# Patient Record
Sex: Male | Born: 2000 | Hispanic: Yes | Marital: Single | Smoking: Never smoker
Health system: Southern US, Community
[De-identification: ages and names within clinical notes are randomized; demographics above are authoritative.]

## PROBLEM LIST (undated history)

## (undated) DIAGNOSIS — E119 Type 2 diabetes mellitus without complications: Secondary | ICD-10-CM

## (undated) HISTORY — PX: TONSILLECTOMY: SUR1361

---
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
## 2019-07-16 ENCOUNTER — Encounter (HOSPITAL_COMMUNITY): Payer: Self-pay

## 2019-07-16 ENCOUNTER — Emergency Department (HOSPITAL_COMMUNITY)
Admission: EM | Admit: 2019-07-16 | Discharge: 2019-07-16 | Disposition: A | Discharge status: Home / Self Care | Payer: Medicaid Other | Attending: Emergency Medicine | Admitting: Emergency Medicine

## 2019-07-16 ENCOUNTER — Other Ambulatory Visit: Payer: Self-pay

## 2019-07-16 ENCOUNTER — Emergency Department (HOSPITAL_COMMUNITY): Payer: Medicaid Other

## 2019-07-16 DIAGNOSIS — Z79899 Other long term (current) drug therapy: Secondary | ICD-10-CM | POA: Insufficient documentation

## 2019-07-16 DIAGNOSIS — R070 Pain in throat: Secondary | ICD-10-CM | POA: Diagnosis present

## 2019-07-16 DIAGNOSIS — Z20822 Contact with and (suspected) exposure to covid-19: Secondary | ICD-10-CM | POA: Diagnosis not present

## 2019-07-16 DIAGNOSIS — J069 Acute upper respiratory infection, unspecified: Secondary | ICD-10-CM | POA: Diagnosis not present

## 2019-07-16 DIAGNOSIS — R0789 Other chest pain: Secondary | ICD-10-CM

## 2019-07-16 LAB — SARS CORONAVIRUS 2 BY RT PCR (HOSPITAL ORDER, PERFORMED IN ~~LOC~~ HOSPITAL LAB): SARS Coronavirus 2: NEGATIVE

## 2019-07-16 LAB — GROUP A STREP BY PCR: Group A Strep by PCR: NOT DETECTED

## 2019-07-16 NOTE — ED Notes (Signed)
Pt verbalizes understanding of DC instructions. Pt belongings returned and is ambulatory out of ED.  

## 2019-07-16 NOTE — Discharge Instructions (Addendum)
Likely viral illness.  Your strep test is negative, your chest x-ray is normal.  Your Covid test is pending, recommend complete quarantine of 10 days after your last contact with your roommate.  Follow-up with your doctor as needed.

## 2019-07-16 NOTE — ED Provider Notes (Signed)
[facility]-EMERGENCY DEPT Provider Note   CSN: [id number] Arrival date & time: 07/16/19  1820     History Chief Complaint  Patient presents with  . Sore Throat    Andrew Schwartz is a 19 y.o. male.  19 year old male with complaint of sore throat for the past 2 weeks, also reports nausea and intermittent headaches.  No known exposure to strep.  Patient states that today when he was eating he had discomfort in his chest, only occurs with eating and raising his arms.  Denies difficulty swallowing or feeling like his food is getting stuck. States exposed to roommate who has COVID.  Andrew Schwartz was evaluated in Emergency Department on 07/16/2019 for the symptoms described in the history of present illness. He was evaluated in the context of the global COVID-19 pandemic, which necessitated consideration that the patient might be at risk for infection with the SARS-CoV-2 virus that causes COVID-19. Institutional protocols and algorithms that pertain to the evaluation of patients at risk for COVID-19 are in a state of rapid change based on information released by regulatory bodies including the CDC and federal and state organizations. These policies and algorithms were followed during the patient's care in the ED.         History reviewed. No pertinent past medical history.  There are no problems to display for this patient.   Past Surgical History:  Procedure Laterality Date  . TONSILLECTOMY         Family History  Problem Relation Age of Onset  . Hypertension Mother   . Diabetes Mother     Social History   Tobacco Use  . Smoking status: Never Smoker  . Smokeless tobacco: Never Used  Substance Use Topics  . Alcohol use: No  . Drug use: No    Home Medications Prior to Admission medications   Medication Sig Start Date End Date Taking? Authorizing Provider  ibuprofen (ADVIL) 800 MG tablet Take 1 tablet (800 mg total) by mouth every 8 (eight) hours  as needed for mild pain. 08/23/18   Ward, Layla Maw, DO  Multiple Vitamin (MULTIVITAMIN WITH MINERALS) TABS tablet Take 1 tablet by mouth daily.    [provider]  dicyclomine (BENTYL) 20 MG tablet Take 1 tablet (20 mg total) by mouth 2 (two) times daily. Patient taking differently: Take 20 mg by mouth 2 (two) times daily as needed for spasms.  08/02/18 08/23/18  Garlon Hatchet, PA-C    Allergies    Patient has no known allergies.  Review of Systems   Review of Systems  Constitutional: Negative for chills and fever.  HENT: Positive for sore throat. Negative for congestion.   Respiratory: Negative for cough.   Gastrointestinal: Positive for nausea.  Musculoskeletal: Positive for myalgias.  Skin: Negative for rash and wound.  Neurological: Positive for headaches.  Hematological: Negative for adenopathy.  All other systems reviewed and are negative.   Physical Exam Updated Vital Signs BP (!) 148/91 (BP Location: Left Arm)   Pulse 70   Temp 98.2 F (36.8 C) (Oral)   Resp 16   Ht 5\' 6"  (1.676 m)   Wt (!) 140.6 kg   SpO2 100%   BMI 50.04 kg/m   Physical Exam Vitals and nursing note reviewed.  Constitutional:      General: He is not in acute distress.    Appearance: He is well-developed. He is not diaphoretic.  HENT:     Head: Normocephalic and atraumatic.  Right Ear: Tympanic membrane and ear canal normal.     Left Ear: Tympanic membrane and ear canal normal.     Mouth/Throat:     Mouth: Mucous membranes are moist.     Pharynx: Oropharynx is clear. Uvula midline. Posterior oropharyngeal erythema present. No pharyngeal swelling.     Tonsils: No tonsillar exudate or tonsillar abscesses. 1+ on the right. 1+ on the left.  Cardiovascular:     Rate and Rhythm: Normal rate and regular rhythm.     Heart sounds: Normal heart sounds.  Pulmonary:     Effort: Pulmonary effort is normal.     Breath sounds: Normal breath sounds.  Chest:     Chest wall: Tenderness present.   Skin:    General: Skin is warm and dry.  Neurological:     Mental Status: He is alert and oriented to person, place, and time.  Psychiatric:        Behavior: Behavior normal.     ED Results / Procedures / Treatments   Labs (all labs ordered are listed, but only abnormal results are displayed) Labs Reviewed  GROUP A STREP BY PCR  SARS CORONAVIRUS 2 BY RT PCR (HOSPITAL ORDER, [facility])    EKG None  Radiology DG Chest Port 1 View  Result Date: 07/16/2019 CLINICAL DATA:  Chest discomfort, shortness of breath, patient may has COVID-19 EXAM: PORTABLE CHEST 1 VIEW COMPARISON:  Radiograph 09/24/2012 FINDINGS: Hypoventilatory changes. No consolidation, features of edema, pneumothorax, or effusion. Pulmonary vascularity is normally distributed. The cardiomediastinal contours are unremarkable. No acute osseous or soft tissue abnormality. IMPRESSION: Low volumes, otherwise no acute cardiopulmonary disease. Electronically Signed   By: Lovena Le M.D.   On: 07/16/2019 20:56    Procedures Procedures (including critical care time)  Medications Ordered in ED Medications - No data to display  ED Course  I have reviewed the triage vital signs and the nursing notes.  Pertinent labs & imaging results that were available during my care of the patient were reviewed by me and considered in my medical decision making (see chart for details).  Clinical Course as of Jul 16 2222  Sun Jul 15, 2728  [id number] 19 year old male with complaint of sore throat for 2 weeks, pain worse with swallowing, also reports pain in his chest only with eating.  On exam has chest wall tenderness, throat is unremarkable, no tender cervical lymphadenopathy.  Rapid strep is negative, chest x-ray is unremarkable.  Patient states that his roommate had Covid and would like to be tested for Covid, was advised to quarantine pending his test results and follow-up with PCP.   [LM]    Clinical Course User  Index [LM] Andrew Schwartz   MDM Rules/Calculators/A&P                      Final Clinical Impression(s) / ED Diagnoses Final diagnoses:  Viral URI    Rx / DC Orders ED Discharge Orders    None       Tacy Learn, PA-C 07/16/19 Lyons, Lamy, DO 07/16/19 2229

## 2019-07-16 NOTE — ED Triage Notes (Signed)
Patient c/o sore throat x 2 weeks. Patient states hurts more with swallowing. Patient is able to drink and speak complete sentences.

## 2019-11-13 ENCOUNTER — Emergency Department (HOSPITAL_COMMUNITY)
Admission: EM | Admit: 2019-11-13 | Discharge: 2019-11-13 | Disposition: A | Discharge status: Home / Self Care | Payer: Medicaid Other | Attending: Emergency Medicine | Admitting: Emergency Medicine

## 2019-11-13 ENCOUNTER — Encounter (HOSPITAL_COMMUNITY): Payer: Self-pay | Admitting: Emergency Medicine

## 2019-11-13 DIAGNOSIS — Z20822 Contact with and (suspected) exposure to covid-19: Secondary | ICD-10-CM | POA: Diagnosis not present

## 2019-11-13 DIAGNOSIS — N481 Balanitis: Secondary | ICD-10-CM | POA: Diagnosis not present

## 2019-11-13 DIAGNOSIS — N4829 Other inflammatory disorders of penis: Secondary | ICD-10-CM | POA: Diagnosis present

## 2019-11-13 DIAGNOSIS — R739 Hyperglycemia, unspecified: Secondary | ICD-10-CM | POA: Diagnosis not present

## 2019-11-13 DIAGNOSIS — N476 Balanoposthitis: Secondary | ICD-10-CM

## 2019-11-13 LAB — CBC WITH DIFFERENTIAL/PLATELET
Abs Immature Granulocytes: 0.05 10*3/uL (ref 0.00–0.07)
Basophils Absolute: 0.1 10*3/uL (ref 0.0–0.1)
Basophils Relative: 1 %
Eosinophils Absolute: 0.2 10*3/uL (ref 0.0–0.5)
Eosinophils Relative: 2 %
HCT: 45.2 % (ref 39.0–52.0)
Hemoglobin: 15.3 g/dL (ref 13.0–17.0)
Immature Granulocytes: 1 %
Lymphocytes Relative: 28 %
Lymphs Abs: 2.7 10*3/uL (ref 0.7–4.0)
MCH: 29.3 pg (ref 26.0–34.0)
MCHC: 33.8 g/dL (ref 30.0–36.0)
MCV: 86.4 fL (ref 80.0–100.0)
Monocytes Absolute: 0.6 10*3/uL (ref 0.1–1.0)
Monocytes Relative: 6 %
Neutro Abs: 6.2 10*3/uL (ref 1.7–7.7)
Neutrophils Relative %: 62 %
Platelets: 247 10*3/uL (ref 150–400)
RBC: 5.23 MIL/uL (ref 4.22–5.81)
RDW: 12.4 % (ref 11.5–15.5)
WBC: 9.8 10*3/uL (ref 4.0–10.5)
nRBC: 0 % (ref 0.0–0.2)

## 2019-11-13 LAB — SARS CORONAVIRUS 2 BY RT PCR (HOSPITAL ORDER, PERFORMED IN ~~LOC~~ HOSPITAL LAB): SARS Coronavirus 2: NEGATIVE

## 2019-11-13 LAB — COMPREHENSIVE METABOLIC PANEL
ALT: 76 U/L — ABNORMAL HIGH (ref 0–44)
AST: 41 U/L (ref 15–41)
Albumin: 4.1 g/dL (ref 3.5–5.0)
Alkaline Phosphatase: 97 U/L (ref 38–126)
Anion gap: 11 (ref 5–15)
BUN: 8 mg/dL (ref 6–20)
CO2: 25 mmol/L (ref 22–32)
Calcium: 9.5 mg/dL (ref 8.9–10.3)
Chloride: 100 mmol/L (ref 98–111)
Creatinine, Ser: 0.75 mg/dL (ref 0.61–1.24)
GFR calc Af Amer: >60 mL/min (ref >60)
GFR calc non Af Amer: >60 mL/min (ref >60)
Glucose, Bld: 389 mg/dL — ABNORMAL HIGH (ref 70–99)
Potassium: 3.9 mmol/L (ref 3.5–5.1)
Sodium: 136 mmol/L (ref 135–145)
Total Bilirubin: 0.8 mg/dL (ref 0.3–1.2)
Total Protein: 6.9 g/dL (ref 6.5–8.1)

## 2019-11-13 LAB — CBG MONITORING, ED: Glucose-Capillary: 408 mg/dL — ABNORMAL HIGH (ref 70–99)

## 2019-11-13 MED ORDER — METFORMIN HCL 500 MG PO TABS
1000.0000 mg | ORAL_TABLET | Freq: Once | ORAL | Status: AC
  Administered 2019-11-13: 1000 mg via ORAL
  Filled 2019-11-13: qty 2

## 2019-11-13 MED ORDER — SODIUM CHLORIDE 0.9 % IV BOLUS
1000.0000 mL | Freq: Once | INTRAVENOUS | Status: AC
  Administered 2019-11-13: 1000 mL via INTRAVENOUS

## 2019-11-13 MED ORDER — METFORMIN HCL 1000 MG PO TABS: 1000.0000 mg | ORAL_TABLET | Freq: Two times a day (BID) | ORAL | 1 refills | Status: DC

## 2019-11-13 MED ORDER — CLOTRIMAZOLE 1 % EX CREA: TOPICAL_CREAM | CUTANEOUS | 0 refills | Status: AC

## 2019-11-13 NOTE — ED Provider Notes (Signed)
[facility] EMERGENCY DEPARTMENT Provider Note   CSN: [id number] Arrival date & time: 11/13/19  0919     History Chief Complaint  Patient presents with  . Groin Swelling    Andrew Schwartz is a 19 y.o. male.  HPI Patient is a 19 year old male with no pertinent past medical history presented today for penis tip swelling that is been ongoing for approximately 4 days.  Patient denies any penile discharge.  He states he has only been sexually active with one partner who has not had any symptoms of STDs.  He denies any discharge or testicular pain.  He states that he is normally able to retract his foreskin but has not been able to since this morning.  He states that the pain is achy and constant now, denies any difficulty urinating.  Denies any fevers, chills, frequent urination dysuria urgency or hematuria.    History reviewed. No pertinent past medical history.  There are no problems to display for this patient.   Past Surgical History:  Procedure Laterality Date  . TONSILLECTOMY         Family History  Problem Relation Age of Onset  . Hypertension Mother   . Diabetes Mother     Social History   Tobacco Use  . Smoking status: Never Smoker  . Smokeless tobacco: Never Used  Vaping Use  . Vaping Use: Never used  Substance Use Topics  . Alcohol use: No  . Drug use: No    Home Medications Prior to Admission medications   Medication Sig Start Date End Date Taking? Authorizing Provider  clotrimazole (LOTRIMIN) 1 % cream Apply to affected area 2 times daily 11/13/19   Solon Augusta S, PA  ibuprofen (ADVIL) 800 MG tablet Take 1 tablet (800 mg total) by mouth every 8 (eight) hours as needed for mild pain. 08/23/18   Ward, Layla Maw, DO  metFORMIN (GLUCOPHAGE) 1000 MG tablet Take 1 tablet (1,000 mg total) by mouth 2 (two) times daily with a meal. 11/13/19 01/12/20  Gailen Shelter, PA  Multiple Vitamin (MULTIVITAMIN WITH MINERALS) TABS tablet Take 1  tablet by mouth daily.    [provider]  dicyclomine (BENTYL) 20 MG tablet Take 1 tablet (20 mg total) by mouth 2 (two) times daily. Patient taking differently: Take 20 mg by mouth 2 (two) times daily as needed for spasms.  08/02/18 08/23/18  Garlon Hatchet, PA-C    Allergies    Patient has no known allergies.  Review of Systems   Review of Systems  Constitutional: Negative for chills and fever.  HENT: Negative for congestion.   Respiratory: Negative for shortness of breath.   Cardiovascular: Negative for chest pain.  Gastrointestinal: Negative for abdominal pain.  Genitourinary: Positive for penile swelling.  Musculoskeletal: Negative for neck pain.    Physical Exam Updated Vital Signs BP 136/74 (BP Location: Right Arm)   Pulse 77   Temp 98.8 F (37.1 C) (Oral)   Resp 18   Ht 5\' 6"  (1.676 m)   Wt (!) 140.6 kg   SpO2 100%   BMI 50.04 kg/m   Physical Exam Vitals and nursing note reviewed.  Constitutional:      General: He is not in acute distress.    Appearance: Normal appearance. He is obese. He is not ill-appearing.  HENT:     Head: Normocephalic and atraumatic.     Mouth/Throat:     Mouth: Mucous membranes are moist.  Eyes:     General:  No scleral icterus.       Right eye: No discharge.        Left eye: No discharge.     Conjunctiva/sclera: Conjunctivae normal.  Pulmonary:     Effort: Pulmonary effort is normal.     Breath sounds: No stridor.  Abdominal:     Tenderness: There is no abdominal tenderness.  Genitourinary:    Testes: Normal.     Comments: Phimosis with balanitis; no DC. Minimal swelling at tip of penis.  Skin:    General: Skin is warm and dry.     Capillary Refill: Capillary refill takes less than 2 seconds.  Neurological:     Mental Status: He is alert and oriented to person, place, and time. Mental status is at baseline.     ED Results / Procedures / Treatments   Labs (all labs ordered are listed, but only abnormal results are  displayed) Labs Reviewed  COMPREHENSIVE METABOLIC PANEL - Abnormal; Notable for the following components:      Result Value   Glucose, Bld 389 (*)    ALT 76 (*)    All other components within normal limits  CBG MONITORING, ED - Abnormal; Notable for the following components:   Glucose-Capillary 408 (*)    All other components within normal limits  SARS CORONAVIRUS 2 BY RT PCR (HOSPITAL ORDER, PERFORMED IN [facility])  CBC WITH DIFFERENTIAL/PLATELET    EKG None  Radiology No results found.  Procedures Procedures (including critical care time)  Medications Ordered in ED Medications  sodium chloride 0.9 % bolus 1,000 mL (0 mLs Intravenous Stopped 11/13/19 1712)  metFORMIN (GLUCOPHAGE) tablet 1,000 mg (1,000 mg Oral Given 11/13/19 1905)    ED Course  I have reviewed the triage vital signs and the nursing notes.  Pertinent labs & imaging results that were available during my care of the patient were reviewed by me and considered in my medical decision making (see chart for details).  Patient here with balanoposthitis   Clinical Course as of Nov 13 1952  Mon Nov 13, 2019  1425 BS 200 two months ago. Will check CBG today to assess for new onset diabetes/hyperglycemia   [WF]    Clinical Course User Index [WF] Gailen Shelter, PA   Patient CBG was 400.  Patient given fluids and basic labs obtained which were unremarkable.  CBC abnormalities no electrolyte abnormalities with an anion gap that was within normal limits at 11.  Consulted Dr. Crissie Reese for recommendations for outpatient treatment versus admission for new onset diabetes.  Appreciate his expert consultation.  Will discharge patient with Metformin.  We will also treat the balanitis with clotrimazole.  Patient given urology follow-up as well as PCP follow-up which was followed by a very long education conversation about diet exercise and blood sugar.  Also discussed the importance of medication adherence and  the long-term effects of diabetes if left untreated.  MDM Rules/Calculators/A&P                          Final Clinical Impression(s) / ED Diagnoses Final diagnoses:  Balanitis  Hyperglycemia  Balanoposthitis    Rx / DC Orders ED Discharge Orders         Ordered    clotrimazole (LOTRIMIN) 1 % cream        11/13/19 1832    metFORMIN (GLUCOPHAGE) 1000 MG tablet  2 times daily with meals  11/13/19 1832           Gailen Shelter, PA 11/14/19 1954    Tegeler, Canary Brim, MD 11/15/19 1728

## 2019-11-13 NOTE — ED Triage Notes (Signed)
Pt states for the last 4 days he has had pain in the tip of his penis with redness and swelling. Pt denies any d/c from his penis.

## 2019-11-13 NOTE — Discharge Instructions (Addendum)
You have elevated blood sugar consistent with diabetes.  It is incredibly important for you to follow-up closely with your primary care doctor in order to have your blood sugars rechecked.  I have started you on a medication called Metformin which can cause some diarrhea.  You will begin taking 1000 mg at night every night and follow-up with your primary care physician/NP/PA in the next week or 2.  Please drink plenty of water.  It is very easy to get severely dehydrated with your blood sugars as high.  Your elevated blood sugar is a reason why your penis is swollen today.  Please apply the clotrimazole every 12 hours for the next 5 days if your symptoms have not resolved by then you can continue to apply the clotrimazole.  I have provided you with a urologist to follow-up with.  Call tomorrow to make an appointment.

## 2019-11-19 ENCOUNTER — Other Ambulatory Visit: Payer: Self-pay

## 2019-11-19 ENCOUNTER — Emergency Department (HOSPITAL_COMMUNITY)
Admission: EM | Admit: 2019-11-19 | Discharge: 2019-11-19 | Disposition: A | Discharge status: Home / Self Care | Payer: Medicaid Other | Attending: Emergency Medicine | Admitting: Emergency Medicine

## 2019-11-19 ENCOUNTER — Emergency Department (HOSPITAL_COMMUNITY): Payer: Medicaid Other

## 2019-11-19 ENCOUNTER — Encounter (HOSPITAL_COMMUNITY): Payer: Self-pay | Admitting: Emergency Medicine

## 2019-11-19 DIAGNOSIS — Z5321 Procedure and treatment not carried out due to patient leaving prior to being seen by health care provider: Secondary | ICD-10-CM | POA: Diagnosis not present

## 2019-11-19 DIAGNOSIS — R112 Nausea with vomiting, unspecified: Secondary | ICD-10-CM | POA: Diagnosis not present

## 2019-11-19 DIAGNOSIS — Z20822 Contact with and (suspected) exposure to covid-19: Secondary | ICD-10-CM | POA: Insufficient documentation

## 2019-11-19 DIAGNOSIS — R0602 Shortness of breath: Secondary | ICD-10-CM | POA: Insufficient documentation

## 2019-11-19 DIAGNOSIS — R42 Dizziness and giddiness: Secondary | ICD-10-CM | POA: Insufficient documentation

## 2019-11-19 LAB — SARS CORONAVIRUS 2 BY RT PCR (HOSPITAL ORDER, PERFORMED IN ~~LOC~~ HOSPITAL LAB): SARS Coronavirus 2: NEGATIVE

## 2019-11-19 LAB — BASIC METABOLIC PANEL
Anion gap: 12 (ref 5–15)
BUN: 14 mg/dL (ref 6–20)
CO2: 22 mmol/L (ref 22–32)
Calcium: 9.2 mg/dL (ref 8.9–10.3)
Chloride: 105 mmol/L (ref 98–111)
Creatinine, Ser: 0.83 mg/dL (ref 0.61–1.24)
GFR calc Af Amer: >60 mL/min (ref >60)
GFR calc non Af Amer: >60 mL/min (ref >60)
Glucose, Bld: 212 mg/dL — ABNORMAL HIGH (ref 70–99)
Potassium: 3.7 mmol/L (ref 3.5–5.1)
Sodium: 139 mmol/L (ref 135–145)

## 2019-11-19 LAB — CBC
HCT: 43.2 % (ref 39.0–52.0)
Hemoglobin: 14.2 g/dL (ref 13.0–17.0)
MCH: 29.2 pg (ref 26.0–34.0)
MCHC: 32.9 g/dL (ref 30.0–36.0)
MCV: 88.7 fL (ref 80.0–100.0)
Platelets: 257 10*3/uL (ref 150–400)
RBC: 4.87 MIL/uL (ref 4.22–5.81)
RDW: 12.3 % (ref 11.5–15.5)
WBC: 9.3 10*3/uL (ref 4.0–10.5)
nRBC: 0 % (ref 0.0–0.2)

## 2019-11-19 MED ORDER — SODIUM CHLORIDE 0.9% FLUSH: 3.0000 mL | Freq: Once | INTRAVENOUS | Status: DC

## 2019-11-19 NOTE — ED Triage Notes (Signed)
Pt reports SOB and nausea x 1 hour.  States he feels like he is going to pass out.

## 2020-01-26 ENCOUNTER — Emergency Department (HOSPITAL_COMMUNITY)
Admission: EM | Admit: 2020-01-26 | Discharge: 2020-01-27 | Disposition: A | Discharge status: Home / Self Care | Payer: Medicaid Other | Attending: Emergency Medicine | Admitting: Emergency Medicine

## 2020-01-26 ENCOUNTER — Other Ambulatory Visit: Payer: Self-pay

## 2020-01-26 ENCOUNTER — Encounter (HOSPITAL_COMMUNITY): Payer: Self-pay

## 2020-01-26 DIAGNOSIS — R112 Nausea with vomiting, unspecified: Secondary | ICD-10-CM | POA: Diagnosis present

## 2020-01-26 DIAGNOSIS — Z20822 Contact with and (suspected) exposure to covid-19: Secondary | ICD-10-CM | POA: Insufficient documentation

## 2020-01-26 DIAGNOSIS — R197 Diarrhea, unspecified: Secondary | ICD-10-CM | POA: Diagnosis not present

## 2020-01-26 LAB — COMPREHENSIVE METABOLIC PANEL
ALT: 36 U/L (ref 0–44)
AST: 20 U/L (ref 15–41)
Albumin: 4 g/dL (ref 3.5–5.0)
Alkaline Phosphatase: 62 U/L (ref 38–126)
Anion gap: 11 (ref 5–15)
BUN: 13 mg/dL (ref 6–20)
CO2: 25 mmol/L (ref 22–32)
Calcium: 9.3 mg/dL (ref 8.9–10.3)
Chloride: 109 mmol/L (ref 98–111)
Creatinine, Ser: 0.77 mg/dL (ref 0.61–1.24)
GFR, Estimated: >60 mL/min (ref >60)
Glucose, Bld: 125 mg/dL — ABNORMAL HIGH (ref 70–99)
Potassium: 3.2 mmol/L — ABNORMAL LOW (ref 3.5–5.1)
Sodium: 145 mmol/L (ref 135–145)
Total Bilirubin: 0.5 mg/dL (ref 0.3–1.2)
Total Protein: 6.7 g/dL (ref 6.5–8.1)

## 2020-01-26 LAB — CBC
HCT: 39 % (ref 39.0–52.0)
Hemoglobin: 13.1 g/dL (ref 13.0–17.0)
MCH: 30.3 pg (ref 26.0–34.0)
MCHC: 33.6 g/dL (ref 30.0–36.0)
MCV: 90.3 fL (ref 80.0–100.0)
Platelets: 248 10*3/uL (ref 150–400)
RBC: 4.32 MIL/uL (ref 4.22–5.81)
RDW: 13.1 % (ref 11.5–15.5)
WBC: 12.9 10*3/uL — ABNORMAL HIGH (ref 4.0–10.5)
nRBC: 0 % (ref 0.0–0.2)

## 2020-01-26 LAB — RESP PANEL BY RT-PCR (FLU A&B, COVID) ARPGX2
Influenza A by PCR: NEGATIVE
Influenza B by PCR: NEGATIVE
SARS Coronavirus 2 by RT PCR: NEGATIVE

## 2020-01-26 LAB — CBG MONITORING, ED: Glucose-Capillary: 120 mg/dL — ABNORMAL HIGH (ref 70–99)

## 2020-01-26 LAB — LIPASE, BLOOD: Lipase: 23 U/L (ref 11–51)

## 2020-01-26 MED ORDER — ONDANSETRON HCL 4 MG/2ML IJ SOLN
4.0000 mg | Freq: Once | INTRAMUSCULAR | Status: AC | PRN
  Administered 2020-01-26: 4 mg via INTRAVENOUS
  Filled 2020-01-26: qty 2

## 2020-01-26 MED ORDER — LACTATED RINGERS IV BOLUS
1000.0000 mL | Freq: Once | INTRAVENOUS | Status: AC
  Administered 2020-01-26: 1000 mL via INTRAVENOUS

## 2020-01-26 MED ORDER — ONDANSETRON 4 MG PO TBDP: 4.0000 mg | ORAL_TABLET | Freq: Three times a day (TID) | ORAL | 0 refills | Status: AC | PRN

## 2020-01-26 MED ORDER — ONDANSETRON 4 MG PO TBDP: 4.0000 mg | ORAL_TABLET | Freq: Three times a day (TID) | ORAL | 0 refills | Status: DC | PRN

## 2020-01-26 NOTE — Discharge Instructions (Signed)
Make sure you hydrate well, half Gatorade half water is good electrolyte sugar and hydration replacement.  Zofran is given for nausea vomiting.  Follow-up with your providers in a few days if you are not feeling better.

## 2020-01-26 NOTE — ED Provider Notes (Signed)
[facility]-EMERGENCY DEPT Provider Note   CSN: [id number] Arrival date & time: 01/26/20  2117     History Chief Complaint  Patient presents with  . Covid Symptoms    Andrew Schwartz is a 19 y.o. male.   Emesis Severity:  Moderate Duration:  1 day Timing:  Constant Quality:  Stomach contents Progression:  Unchanged Chronicity:  New Relieved by:  Nothing Worsened by:  Nothing Ineffective treatments:  None tried Associated symptoms: diarrhea   Associated symptoms: no arthralgias, no chills, no cough, no fever and no headaches        History reviewed. No pertinent past medical history.  There are no problems to display for this patient.   Past Surgical History:  Procedure Laterality Date  . TONSILLECTOMY         Family History  Problem Relation Age of Onset  . Hypertension Mother   . Diabetes Mother     Social History   Tobacco Use  . Smoking status: Never Smoker  . Smokeless tobacco: Never Used  Vaping Use  . Vaping Use: Never used  Substance Use Topics  . Alcohol use: No  . Drug use: No    Home Medications Prior to Admission medications   Medication Sig Start Date End Date Taking? Authorizing Provider  clotrimazole (LOTRIMIN) 1 % cream Apply to affected area 2 times daily 11/13/19   Solon Augusta S, PA  ibuprofen (ADVIL) 800 MG tablet Take 1 tablet (800 mg total) by mouth every 8 (eight) hours as needed for mild pain. 08/23/18   Ward, Layla Maw, DO  metFORMIN (GLUCOPHAGE) 1000 MG tablet Take 1 tablet (1,000 mg total) by mouth 2 (two) times daily with a meal. 11/13/19 01/12/20  Gailen Shelter, PA  Multiple Vitamin (MULTIVITAMIN WITH MINERALS) TABS tablet Take 1 tablet by mouth daily.    [provider]  ondansetron (ZOFRAN ODT) 4 MG disintegrating tablet Take 1 tablet (4 mg total) by mouth every 8 (eight) hours as needed for up to 10 doses for nausea or vomiting. 01/26/20   Sabino Donovan, MD  dicyclomine (BENTYL) 20 MG  tablet Take 1 tablet (20 mg total) by mouth 2 (two) times daily. Patient taking differently: Take 20 mg by mouth 2 (two) times daily as needed for spasms.  08/02/18 08/23/18  Garlon Hatchet, PA-C    Allergies    Patient has no known allergies.  Review of Systems   Review of Systems  Constitutional: Negative for chills and fever.  HENT: Negative for congestion and rhinorrhea.   Respiratory: Negative for cough and shortness of breath.   Cardiovascular: Negative for chest pain and palpitations.  Gastrointestinal: Positive for diarrhea and vomiting. Negative for nausea.  Genitourinary: Negative for difficulty urinating and dysuria.  Musculoskeletal: Negative for arthralgias and back pain.  Skin: Negative for color change and rash.  Neurological: Positive for light-headedness. Negative for headaches.    Physical Exam Updated Vital Signs BP 129/69   Pulse 61   Temp 97.9 F (36.6 C) (Oral)   Resp 18   SpO2 98%   Physical Exam Vitals and nursing note reviewed.  Constitutional:      General: He is not in acute distress.    Appearance: Normal appearance.  HENT:     Head: Normocephalic and atraumatic.     Nose: No rhinorrhea.     Mouth/Throat:     Mouth: Mucous membranes are moist.  Eyes:     General:  Right eye: No discharge.        Left eye: No discharge.     Conjunctiva/sclera: Conjunctivae normal.  Cardiovascular:     Rate and Rhythm: Normal rate and regular rhythm.  Pulmonary:     Effort: Pulmonary effort is normal.     Breath sounds: No stridor.  Abdominal:     General: Abdomen is flat. There is no distension.     Palpations: Abdomen is soft.     Tenderness: There is no abdominal tenderness. There is no guarding or rebound.  Musculoskeletal:        General: No deformity or signs of injury.  Skin:    General: Skin is warm and dry.     Capillary Refill: Capillary refill takes less than 2 seconds.  Neurological:     General: No focal deficit present.     Mental  Status: He is alert. Mental status is at baseline.     Motor: No weakness.  Psychiatric:        Mood and Affect: Mood normal.        Behavior: Behavior normal.        Thought Content: Thought content normal.     ED Results / Procedures / Treatments   Labs (all labs ordered are listed, but only abnormal results are displayed) Labs Reviewed  COMPREHENSIVE METABOLIC PANEL - Abnormal; Notable for the following components:      Result Value   Potassium 3.2 (*)    Glucose, Bld 125 (*)    All other components within normal limits  CBC - Abnormal; Notable for the following components:   WBC 12.9 (*)    All other components within normal limits  CBG MONITORING, ED - Abnormal; Notable for the following components:   Glucose-Capillary 120 (*)    All other components within normal limits  RESP PANEL BY RT-PCR (FLU A&B, COVID) ARPGX2  LIPASE, BLOOD  URINALYSIS, ROUTINE W REFLEX MICROSCOPIC    EKG None  Radiology No results found.  Procedures Procedures (including critical care time)  Medications Ordered in ED Medications  ondansetron (ZOFRAN) injection 4 mg (has no administration in time range)  lactated ringers bolus 1,000 mL (1,000 mLs Intravenous New Bag/Given 01/26/20 2230)    ED Course  I have reviewed the triage vital signs and the nursing notes.  Pertinent labs & imaging results that were available during my care of the patient were reviewed by me and considered in my medical decision making (see chart for details).    MDM Rules/Calculators/A&P                          24 hours nausea vomiting diarrhea.  No fevers.  Sick contact, negative for Covid here.  CBC shows mild leukocytosis.  No signs of peritonitis on exam.  Symptomatic control for fluids and antiemetics given.  Hopefully this patient's laboratory studies will be unremarkable and he can be discharged home with outpatient care and prescription for Zofran.  IV fluids complete, Zofran given patient feeling  slightly better.  Still feeling a little lightheaded but normotensive.  Glucose mildly elevated, potassium slightly decreased.  Overall normal labs and no signs of endorgan dysfunction.  He is safe for discharge home.  We will discontinue urine studies as he has no urinary symptoms.  Outpatient provider follow-up recommended prescription for antiemetics given.  Final Clinical Impression(s) / ED Diagnoses Final diagnoses:  Nausea vomiting and diarrhea    Rx / DC Orders  ED Discharge Orders         Ordered    ondansetron (ZOFRAN ODT) 4 MG disintegrating tablet  Every 8 hours PRN,   Status:  Discontinued        01/26/20 2343    ondansetron (ZOFRAN ODT) 4 MG disintegrating tablet  Every 8 hours PRN        01/26/20 2344           Sabino Donovan, MD 01/26/20 2344

## 2020-01-26 NOTE — ED Triage Notes (Signed)
Pt reports blurred vision, cough, mild shob, diarrhea and  Headache. Pt denies fever or chills. Pt sts he is diabetic. cbg 120 in triage.

## 2020-05-28 ENCOUNTER — Encounter (HOSPITAL_COMMUNITY): Payer: Self-pay

## 2020-05-28 ENCOUNTER — Emergency Department (HOSPITAL_COMMUNITY): Payer: Medicaid Other

## 2020-05-28 ENCOUNTER — Emergency Department (HOSPITAL_COMMUNITY)
Admission: EM | Admit: 2020-05-28 | Discharge: 2020-05-28 | Disposition: A | Discharge status: Home / Self Care | Payer: Medicaid Other | Attending: Emergency Medicine | Admitting: Emergency Medicine

## 2020-05-28 ENCOUNTER — Other Ambulatory Visit: Payer: Self-pay

## 2020-05-28 DIAGNOSIS — N342 Other urethritis: Secondary | ICD-10-CM | POA: Insufficient documentation

## 2020-05-28 DIAGNOSIS — Z20822 Contact with and (suspected) exposure to covid-19: Secondary | ICD-10-CM | POA: Insufficient documentation

## 2020-05-28 DIAGNOSIS — J069 Acute upper respiratory infection, unspecified: Secondary | ICD-10-CM | POA: Insufficient documentation

## 2020-05-28 DIAGNOSIS — Z7984 Long term (current) use of oral hypoglycemic drugs: Secondary | ICD-10-CM | POA: Insufficient documentation

## 2020-05-28 DIAGNOSIS — R739 Hyperglycemia, unspecified: Secondary | ICD-10-CM

## 2020-05-28 DIAGNOSIS — R300 Dysuria: Secondary | ICD-10-CM | POA: Diagnosis present

## 2020-05-28 DIAGNOSIS — E1165 Type 2 diabetes mellitus with hyperglycemia: Secondary | ICD-10-CM | POA: Diagnosis not present

## 2020-05-28 LAB — URINALYSIS, ROUTINE W REFLEX MICROSCOPIC
Bilirubin Urine: NEGATIVE
Glucose, UA: NEGATIVE mg/dL
Hgb urine dipstick: NEGATIVE
Ketones, ur: NEGATIVE mg/dL
Leukocytes,Ua: NEGATIVE
Nitrite: NEGATIVE
Protein, ur: NEGATIVE mg/dL
Specific Gravity, Urine: 1.036 — ABNORMAL HIGH (ref 1.005–1.030)
pH: 5 (ref 5.0–8.0)

## 2020-05-28 LAB — BASIC METABOLIC PANEL
Anion gap: 9 (ref 5–15)
BUN: 11 mg/dL (ref 6–20)
CO2: 23 mmol/L (ref 22–32)
Calcium: 8.8 mg/dL — ABNORMAL LOW (ref 8.9–10.3)
Chloride: 104 mmol/L (ref 98–111)
Creatinine, Ser: 0.77 mg/dL (ref 0.61–1.24)
GFR, Estimated: >60 mL/min (ref >60)
Glucose, Bld: 251 mg/dL — ABNORMAL HIGH (ref 70–99)
Potassium: 3.5 mmol/L (ref 3.5–5.1)
Sodium: 136 mmol/L (ref 135–145)

## 2020-05-28 LAB — CBC WITH DIFFERENTIAL/PLATELET
Abs Immature Granulocytes: 0.04 10*3/uL (ref 0.00–0.07)
Basophils Absolute: 0.1 10*3/uL (ref 0.0–0.1)
Basophils Relative: 0 %
Eosinophils Absolute: 0.1 10*3/uL (ref 0.0–0.5)
Eosinophils Relative: 1 %
HCT: 38.7 % — ABNORMAL LOW (ref 39.0–52.0)
Hemoglobin: 13.4 g/dL (ref 13.0–17.0)
Immature Granulocytes: 0 %
Lymphocytes Relative: 15 %
Lymphs Abs: 1.7 10*3/uL (ref 0.7–4.0)
MCH: 30.5 pg (ref 26.0–34.0)
MCHC: 34.6 g/dL (ref 30.0–36.0)
MCV: 88 fL (ref 80.0–100.0)
Monocytes Absolute: 1 10*3/uL (ref 0.1–1.0)
Monocytes Relative: 9 %
Neutro Abs: 8.8 10*3/uL — ABNORMAL HIGH (ref 1.7–7.7)
Neutrophils Relative %: 75 %
Platelets: 216 10*3/uL (ref 150–400)
RBC: 4.4 MIL/uL (ref 4.22–5.81)
RDW: 12.7 % (ref 11.5–15.5)
WBC: 11.7 10*3/uL — ABNORMAL HIGH (ref 4.0–10.5)
nRBC: 0 % (ref 0.0–0.2)

## 2020-05-28 LAB — SARS CORONAVIRUS 2 (TAT 6-24 HRS): SARS Coronavirus 2: NEGATIVE

## 2020-05-28 LAB — CBG MONITORING, ED: Glucose-Capillary: 210 mg/dL — ABNORMAL HIGH (ref 70–99)

## 2020-05-28 LAB — HIV ANTIBODY (ROUTINE TESTING W REFLEX): HIV Screen 4th Generation wRfx: NONREACTIVE

## 2020-05-28 MED ORDER — DOXYCYCLINE HYCLATE 100 MG PO TABS
100.0000 mg | ORAL_TABLET | Freq: Once | ORAL | Status: AC
  Administered 2020-05-28: 100 mg via ORAL
  Filled 2020-05-28: qty 1

## 2020-05-28 MED ORDER — DOXYCYCLINE HYCLATE 100 MG PO CAPS: 100.0000 mg | ORAL_CAPSULE | Freq: Two times a day (BID) | ORAL | 0 refills | Status: AC

## 2020-05-28 MED ORDER — ACETAMINOPHEN 500 MG PO TABS
1000.0000 mg | ORAL_TABLET | Freq: Once | ORAL | Status: AC
  Administered 2020-05-28: 1000 mg via ORAL
  Filled 2020-05-28: qty 2

## 2020-05-28 MED ORDER — SODIUM CHLORIDE 0.9 % IV BOLUS
1000.0000 mL | Freq: Once | INTRAVENOUS | Status: AC
  Administered 2020-05-28: 1000 mL via INTRAVENOUS

## 2020-05-28 MED ORDER — STERILE WATER FOR INJECTION IJ SOLN
INTRAMUSCULAR | Status: AC
  Administered 2020-05-28: 10 mL
  Filled 2020-05-28: qty 10

## 2020-05-28 MED ORDER — METFORMIN HCL 1000 MG PO TABS: 1000.0000 mg | ORAL_TABLET | Freq: Two times a day (BID) | ORAL | 1 refills | Status: AC

## 2020-05-28 MED ORDER — CEFTRIAXONE SODIUM 500 MG IJ SOLR
500.0000 mg | Freq: Once | INTRAMUSCULAR | Status: AC
  Administered 2020-05-28: 500 mg via INTRAMUSCULAR
  Filled 2020-05-28: qty 500

## 2020-05-28 NOTE — ED Provider Notes (Signed)
[facility] EMERGENCY DEPARTMENT Provider Note   CSN: [id number] Arrival date & time: 05/28/20  1103     History No chief complaint on file.   Seydou Hearns is a 20 y.o. male.  HPI 20 year old male presents with multiple complaints.  The chief complaint is dysuria for the last 2 days.  He has also been having some left-sided testicle pain.  He is not urinating more frequently than typical.  He states sometimes when he drinks sodas he will get this similar type pain but now this is happening despite not drinking sodas.  It is also more frequent.  No vomiting or abdominal pain or back pain.  He had unprotected intercourse with his girlfriend a couple days ago and this is the first time they have done that and so he is a little concerned about STI.  The patient also endorses sore throat, cough and congestion since yesterday.  Somewhat short of breath and somewhat has chest pain when coughing.  Endorses on and off occipital headache.  Has been lightheaded since standing since yesterday.  No vomiting or diarrhea. Has not been checking his glucose. Is out of his diabetic meds so he has been using his mom's.  History reviewed. No pertinent past medical history.  There are no problems to display for this patient.   Past Surgical History:  Procedure Laterality Date  . TONSILLECTOMY         Family History  Problem Relation Age of Onset  . Hypertension Mother   . Diabetes Mother     Social History   Tobacco Use  . Smoking status: Never Smoker  . Smokeless tobacco: Never Used  Vaping Use  . Vaping Use: Never used  Substance Use Topics  . Alcohol use: No  . Drug use: No    Home Medications Prior to Admission medications   Medication Sig Start Date End Date Taking? Authorizing Provider  doxycycline (VIBRAMYCIN) 100 MG capsule Take 1 capsule (100 mg total) by mouth 2 (two) times daily. One po bid x 7 days 05/28/20  Yes Pricilla Loveless, MD  clotrimazole (LOTRIMIN)  1 % cream Apply to affected area 2 times daily 11/13/19   Gailen Shelter, PA  ibuprofen (ADVIL) 800 MG tablet Take 1 tablet (800 mg total) by mouth every 8 (eight) hours as needed for mild pain. 08/23/18   Ward, Layla Maw, DO  metFORMIN (GLUCOPHAGE) 1000 MG tablet Take 1 tablet (1,000 mg total) by mouth 2 (two) times daily with a meal. 05/28/20 07/27/20  Pricilla Loveless, MD  Multiple Vitamin (MULTIVITAMIN WITH MINERALS) TABS tablet Take 1 tablet by mouth daily.    [provider]  ondansetron (ZOFRAN ODT) 4 MG disintegrating tablet Take 1 tablet (4 mg total) by mouth every 8 (eight) hours as needed for up to 10 doses for nausea or vomiting. 01/26/20   Sabino Donovan, MD  dicyclomine (BENTYL) 20 MG tablet Take 1 tablet (20 mg total) by mouth 2 (two) times daily. Patient taking differently: Take 20 mg by mouth 2 (two) times daily as needed for spasms.  08/02/18 08/23/18  Garlon Hatchet, PA-C    Allergies    Patient has no known allergies.  Review of Systems   Review of Systems  Constitutional: Positive for fever (subjective).  HENT: Positive for congestion and sore throat.   Respiratory: Positive for cough.   Gastrointestinal: Negative for abdominal pain, diarrhea and vomiting.  Genitourinary: Positive for dysuria and testicular pain. Negative for penile discharge.  Musculoskeletal: Negative for neck pain.  Neurological: Positive for light-headedness and headaches. Negative for syncope.  All other systems reviewed and are negative.   Physical Exam Updated Vital Signs BP 121/66 (BP Location: Right Arm)   Pulse 79   Temp 98.7 F (37.1 C)   Resp 16   SpO2 100%   Physical Exam Vitals and nursing note reviewed.  Constitutional:      Appearance: He is well-developed. He is obese.  HENT:     Head: Normocephalic and atraumatic.     Right Ear: External ear normal.     Left Ear: External ear normal.     Nose: Nose normal.     Mouth/Throat:     Pharynx: No oropharyngeal exudate or  posterior oropharyngeal erythema.  Eyes:     General:        Right eye: No discharge.        Left eye: No discharge.     Extraocular Movements: Extraocular movements intact.     Pupils: Pupils are equal, round, and reactive to light.  Cardiovascular:     Rate and Rhythm: Normal rate and regular rhythm.     Heart sounds: Normal heart sounds.  Pulmonary:     Effort: Pulmonary effort is normal.     Breath sounds: Normal breath sounds.  Abdominal:     Palpations: Abdomen is soft.     Tenderness: There is no abdominal tenderness.  Genitourinary:    Penis: Uncircumcised. No erythema, tenderness, discharge, swelling or lesions.      Testes:        Right: Tenderness or swelling not present.        Left: Tenderness present. Swelling not present.  Musculoskeletal:     Cervical back: Normal range of motion and neck supple. No rigidity.  Skin:    General: Skin is warm and dry.  Neurological:     Mental Status: He is alert.     Comments: CN 3-12 grossly intact. 5/5 strength in all 4 extremities. Grossly normal sensation. Normal finger to nose.   Psychiatric:        Mood and Affect: Mood is not anxious.     ED Results / Procedures / Treatments   Labs (all labs ordered are listed, but only abnormal results are displayed) Labs Reviewed  URINALYSIS, ROUTINE W REFLEX MICROSCOPIC - Abnormal; Notable for the following components:      Result Value   Color, Urine AMBER (*)    Specific Gravity, Urine 1.036 (*)    All other components within normal limits  BASIC METABOLIC PANEL - Abnormal; Notable for the following components:   Glucose, Bld 251 (*)    Calcium 8.8 (*)    All other components within normal limits  CBC WITH DIFFERENTIAL/PLATELET - Abnormal; Notable for the following components:   WBC 11.7 (*)    HCT 38.7 (*)    Neutro Abs 8.8 (*)    All other components within normal limits  CBG MONITORING, ED - Abnormal; Notable for the following components:   Glucose-Capillary 210 (*)     All other components within normal limits  SARS CORONAVIRUS 2 (TAT 6-24 HRS)  HIV ANTIBODY (ROUTINE TESTING W REFLEX)  RPR  GC/CHLAMYDIA PROBE AMP ([place]) NOT AT [facility]    EKG EKG Interpretation  Date/Time:  Tuesday May 28 2020 12:15:14 EDT Ventricular Rate:  88 PR Interval:  167 QRS Duration: 103 QT Interval:  360 QTC Calculation: 436 R Axis:   -30 Text Interpretation: Sinus  rhythm Left axis deviation Low voltage, precordial leads no acute ST/T changes similar to Nov 2021 Confirmed by Pricilla Loveless [id number]) on 05/28/2020 12:54:07 PM   Radiology DG Chest 2 View  Result Date: 05/28/2020 CLINICAL DATA:  Cough and congestion EXAM: CHEST - 2 VIEW COMPARISON:  November 19, 2019 FINDINGS: Lungs are clear. Heart size and pulmonary vascularity are normal. No adenopathy. No bone lesions. IMPRESSION: Lungs clear.  Cardiac silhouette normal. Electronically Signed   By: Bretta Bang III M.D.   On: 05/28/2020 12:24   US SCROTUM W/DOPPLER  Result Date: 05/28/2020 CLINICAL DATA:  LEFT testicle pain for 2 days. EXAM: SCROTAL ULTRASOUND DOPPLER ULTRASOUND OF THE TESTICLES TECHNIQUE: Complete ultrasound examination of the testicles, epididymis, and other scrotal structures was performed. Color and spectral Doppler ultrasound were also utilized to evaluate blood flow to the testicles. COMPARISON:  None FINDINGS: Right testicle Measurements: 4.5 x 2.5 x 2.5 cm. Microlithiasis without visible lesion in the RIGHT testis. Left testicle Measurements: 4.2 x 2.2 x 3.0 cm. Scattered microlithiasis. No visible intra testicular lesion. Right epididymis:  Normal in size and appearance. Left epididymis:  Normal in size and appearance. Hydrocele:  None visualized. Varicocele:  None visualized. Pulsed Doppler interrogation of both testes demonstrates normal low resistance waveforms on arterial and venous assessment in the RIGHT testis. Normal venous waveforms in the LEFT testis. Arterial waveforms as  measured, 1 normal and the other mildly elevated resistive index, more likely related to technical factors. IMPRESSION: 1. Microlithiasis without visible intra testicular lesion. Current literature suggests that testicular microlithiasis is not a significant independent risk factor for development of testicular carcinoma, and that follow up imaging is not warranted in the absence of other risk factors. Monthly testicular self-examination and annual physical exams are considered appropriate surveillance. If patient has other risk factors for testicular carcinoma, then referral to Urology should be considered. (Reference: DeCastro, et al.: A 5-Year Follow up Study of Asymptomatic Men with Testicular Microlithiasis. J Urol 2008; 179:1420-1423.) 2. Normal waveforms both arterial and venous in the RIGHT testis with single measurement that shows mild elevation of resistive index in the LEFT testis, symptomatic side but with preserved venous flow. This is felt to be more related to technical factors. If there is continued concern for testicular torsion could consider short interval follow-up. Electronically Signed   By: Donzetta Kohut M.D.   On: 05/28/2020 13:17    Procedures Procedures   Medications Ordered in ED Medications  sodium chloride 0.9 % bolus 1,000 mL (0 mLs Intravenous Stopped 05/28/20 1353)  acetaminophen (TYLENOL) tablet 1,000 mg (1,000 mg Oral Given 05/28/20 1214)  cefTRIAXone (ROCEPHIN) injection 500 mg (500 mg Intramuscular Given 05/28/20 1410)  doxycycline (VIBRA-TABS) tablet 100 mg (100 mg Oral Given 05/28/20 1409)  sterile water (preservative free) injection (10 mLs  Given 05/28/20 1410)    ED Course  I have reviewed the triage vital signs and the nursing notes.  Pertinent labs & imaging results that were available during my care of the patient were reviewed by me and considered in my medical decision making (see chart for details).    MDM Rules/Calculators/A&P                           Patient's sore throat is likely a viral URI with his cough and subjective fever. Will test for covid as well. CXR clear. Unclear cause of dizziness but has benign labs besides some hyperglycemia and benign neuro exam. No thunderclap  headache. No indication for neuro imaging. Given IV fluids. As for his dysuria, will treat as potential STI. Testicular ultrasound without obvious masses, torsion or epididymitis.  Will discharge home to follow-up with urology if continued to have pain.  Leandrew Koyanagidgar Klimaszewski was evaluated in Emergency Department on 05/28/2020 for the symptoms described in the history of present illness. He was evaluated in the context of the global COVID-19 pandemic, which necessitated consideration that the patient might be at risk for infection with the SARS-CoV-2 virus that causes COVID-19. Institutional protocols and algorithms that pertain to the evaluation of patients at risk for COVID-19 are in a state of rapid change based on information released by regulatory bodies including the CDC and federal and state organizations. These policies and algorithms were followed during the patient's care in the ED.  Final Clinical Impression(s) / ED Diagnoses Final diagnoses:  Urethritis  Hyperglycemia  Viral upper respiratory tract infection    Rx / DC Orders ED Discharge Orders         Ordered    doxycycline (VIBRAMYCIN) 100 MG capsule  2 times daily        05/28/20 1359    metFORMIN (GLUCOPHAGE) 1000 MG tablet  2 times daily with meals        05/28/20 1359           Pricilla LovelessGoldston, Zelie Asbill, MD 05/28/20 1516

## 2020-05-28 NOTE — ED Triage Notes (Signed)
Patient complains of dysuria and also complains of runny nose and congestion. NAD

## 2020-05-29 LAB — GC/CHLAMYDIA PROBE AMP (~~LOC~~) NOT AT ARMC
Chlamydia: NEGATIVE
Comment: NEGATIVE
Comment: NORMAL
Neisseria Gonorrhea: NEGATIVE

## 2020-05-29 LAB — RPR: RPR Ser Ql: NONREACTIVE

## 2020-09-13 ENCOUNTER — Emergency Department (HOSPITAL_COMMUNITY): Payer: Medicaid Other

## 2020-09-13 ENCOUNTER — Emergency Department (HOSPITAL_COMMUNITY)
Admission: EM | Admit: 2020-09-13 | Discharge: 2020-09-13 | Disposition: A | Discharge status: Home / Self Care | Payer: Medicaid Other | Attending: Emergency Medicine | Admitting: Emergency Medicine

## 2020-09-13 ENCOUNTER — Other Ambulatory Visit: Payer: Self-pay

## 2020-09-13 ENCOUNTER — Encounter (HOSPITAL_COMMUNITY): Payer: Self-pay

## 2020-09-13 DIAGNOSIS — R000 Tachycardia, unspecified: Secondary | ICD-10-CM | POA: Diagnosis not present

## 2020-09-13 DIAGNOSIS — R079 Chest pain, unspecified: Secondary | ICD-10-CM | POA: Diagnosis present

## 2020-09-13 DIAGNOSIS — K219 Gastro-esophageal reflux disease without esophagitis: Secondary | ICD-10-CM | POA: Insufficient documentation

## 2020-09-13 DIAGNOSIS — Z7984 Long term (current) use of oral hypoglycemic drugs: Secondary | ICD-10-CM | POA: Diagnosis not present

## 2020-09-13 DIAGNOSIS — M549 Dorsalgia, unspecified: Secondary | ICD-10-CM | POA: Diagnosis not present

## 2020-09-13 DIAGNOSIS — R1110 Vomiting, unspecified: Secondary | ICD-10-CM | POA: Diagnosis not present

## 2020-09-13 DIAGNOSIS — E119 Type 2 diabetes mellitus without complications: Secondary | ICD-10-CM | POA: Insufficient documentation

## 2020-09-13 HISTORY — DX: Type 2 diabetes mellitus without complications: E11.9

## 2020-09-13 LAB — BASIC METABOLIC PANEL
Anion gap: 5 (ref 5–15)
BUN: 13 mg/dL (ref 6–20)
CO2: 28 mmol/L (ref 22–32)
Calcium: 8.8 mg/dL — ABNORMAL LOW (ref 8.9–10.3)
Chloride: 103 mmol/L (ref 98–111)
Creatinine, Ser: 0.82 mg/dL (ref 0.61–1.24)
GFR, Estimated: >60 mL/min (ref >60)
Glucose, Bld: 239 mg/dL — ABNORMAL HIGH (ref 70–99)
Potassium: 4.1 mmol/L (ref 3.5–5.1)
Sodium: 136 mmol/L (ref 135–145)

## 2020-09-13 LAB — CBC
HCT: 41.2 % (ref 39.0–52.0)
Hemoglobin: 13.6 g/dL (ref 13.0–17.0)
MCH: 29.5 pg (ref 26.0–34.0)
MCHC: 33 g/dL (ref 30.0–36.0)
MCV: 89.4 fL (ref 80.0–100.0)
Platelets: 194 10*3/uL (ref 150–400)
RBC: 4.61 MIL/uL (ref 4.22–5.81)
RDW: 13 % (ref 11.5–15.5)
WBC: 8 10*3/uL (ref 4.0–10.5)
nRBC: 0 % (ref 0.0–0.2)

## 2020-09-13 LAB — TROPONIN I (HIGH SENSITIVITY): Troponin I (High Sensitivity): <2 ng/L (ref <18)

## 2020-09-13 MED ORDER — LIDOCAINE VISCOUS HCL 2 % MT SOLN
15.0000 mL | Freq: Once | OROMUCOSAL | Status: AC
  Administered 2020-09-13: 15 mL via ORAL
  Filled 2020-09-13: qty 15

## 2020-09-13 MED ORDER — ALUM & MAG HYDROXIDE-SIMETH 200-200-20 MG/5ML PO SUSP
30.0000 mL | Freq: Once | ORAL | Status: AC
  Administered 2020-09-13: 30 mL via ORAL
  Filled 2020-09-13: qty 30

## 2020-09-13 MED ORDER — FAMOTIDINE 20 MG PO TABS: 20.0000 mg | ORAL_TABLET | Freq: Two times a day (BID) | ORAL | 0 refills | Status: AC

## 2020-09-13 MED ORDER — ONDANSETRON 4 MG PO TBDP
4.0000 mg | ORAL_TABLET | Freq: Once | ORAL | Status: AC
  Administered 2020-09-13: 4 mg via ORAL
  Filled 2020-09-13: qty 1

## 2020-09-13 NOTE — Discharge Instructions (Addendum)
Take the prescribed medication as directed.  Try to limit spicy/acidic foods.  I would avoid NSAIDs for now (motrin/aleve/ibuprofen), use tylenol instead.  Do not exceed 2000mg  daily. Follow-up with your primary care doctor. Return to the ED for new or worsening symptoms.

## 2020-09-13 NOTE — ED Provider Notes (Signed)
[facility]-EMERGENCY DEPT Provider Note   CSN: [id number] Arrival date & time: 09/13/20  0303     History Chief Complaint  Patient presents with   Chest Pain   Back Pain    Andrew Schwartz is a 20 y.o. male.  The history is provided by the patient and medical records.  Chest Pain Associated symptoms: nausea   Back Pain Associated symptoms: chest pain    20 year old male with history of diabetes, presenting to the ED with multiple complaints.  Chest pain.  Intermittent for the past 3 to 4 weeks.  States he feels like he has been dehydrated as he has been outside in the heat.  He has been drinking a lot of lemon lime Gatorade which seems to make his pain a lot worse.  This does seem to calm down if he eats something starchy.  Reports pain feels like a burning sensation in middle of his chest and up into the throat.  It does cause some intermittent nausea as well.  No vomiting.  Also reports taking Aleve which seems to be worsening his symptoms as well.  He denies any known cardiac history.  Denies smoking.  Denies history of acid reflux. Diabetes--admits he has been eating poorly and drinking a lot of soda recently.  States he knows he should not but it just "taste better".  He denies any excessive thirst or frequent urination.  He has been taking his diabetes medications as prescribed.  Past Medical History:  Diagnosis Date   Diabetes mellitus without complication (HCC)     There are no problems to display for this patient.   Past Surgical History:  Procedure Laterality Date   TONSILLECTOMY         Family History  Problem Relation Age of Onset   Hypertension Mother    Diabetes Mother    Hypertension Father    Diabetes Father     Social History   Tobacco Use   Smoking status: Never   Smokeless tobacco: Never  Vaping Use   Vaping Use: Some days   Substances: Nicotine  Substance Use Topics   Alcohol use: No   Drug use: No    Home  Medications Prior to Admission medications   Medication Sig Start Date End Date Taking? Authorizing Provider  clotrimazole (LOTRIMIN) 1 % cream Apply to affected area 2 times daily 11/13/19   Gailen Shelter, PA  doxycycline (VIBRAMYCIN) 100 MG capsule Take 1 capsule (100 mg total) by mouth 2 (two) times daily. One po bid x 7 days 05/28/20   Pricilla Loveless, MD  ibuprofen (ADVIL) 800 MG tablet Take 1 tablet (800 mg total) by mouth every 8 (eight) hours as needed for mild pain. 08/23/18   Ward, Layla Maw, DO  metFORMIN (GLUCOPHAGE) 1000 MG tablet Take 1 tablet (1,000 mg total) by mouth 2 (two) times daily with a meal. 05/28/20 07/27/20  Pricilla Loveless, MD  Multiple Vitamin (MULTIVITAMIN WITH MINERALS) TABS tablet Take 1 tablet by mouth daily.    [provider]  ondansetron (ZOFRAN ODT) 4 MG disintegrating tablet Take 1 tablet (4 mg total) by mouth every 8 (eight) hours as needed for up to 10 doses for nausea or vomiting. 01/26/20   Sabino Donovan, MD  dicyclomine (BENTYL) 20 MG tablet Take 1 tablet (20 mg total) by mouth 2 (two) times daily. Patient taking differently: Take 20 mg by mouth 2 (two) times daily as needed for spasms.  08/02/18 08/23/18  Sharilyn Sites  M, PA-C    Allergies    Patient has no known allergies.  Review of Systems   Review of Systems  Cardiovascular:  Positive for chest pain.  Gastrointestinal:  Positive for nausea.  All other systems reviewed and are negative.  Physical Exam Updated Vital Signs BP 135/80   Pulse 100   Temp 99 F (37.2 C) (Oral)   Resp 20   Ht 5\' 6"  (1.676 m)   Wt (!) 145.2 kg   SpO2 100%   BMI 51.65 kg/m   Physical Exam Vitals and nursing note reviewed.  Constitutional:      Appearance: He is well-developed.  HENT:     Head: Normocephalic and atraumatic.  Eyes:     Conjunctiva/sclera: Conjunctivae normal.     Pupils: Pupils are equal, round, and reactive to light.  Cardiovascular:     Rate and Rhythm: Normal rate and regular  rhythm.     Heart sounds: Normal heart sounds.     Comments: HR 98 during exam Pulmonary:     Effort: Pulmonary effort is normal.     Breath sounds: Normal breath sounds. No wheezing or rhonchi.     Comments: Lungs clear, no distress Chest:     Comments: Mid-sternal chest area tender to touch without deformity or signs of trauma Abdominal:     General: Bowel sounds are normal.     Palpations: Abdomen is soft.  Musculoskeletal:        General: Normal range of motion.     Cervical back: Normal range of motion.  Skin:    General: Skin is warm and dry.  Neurological:     Mental Status: He is alert and oriented to person, place, and time.    ED Results / Procedures / Treatments   Labs (all labs ordered are listed, but only abnormal results are displayed) Labs Reviewed  BASIC METABOLIC PANEL - Abnormal; Notable for the following components:      Result Value   Glucose, Bld 239 (*)    Calcium 8.8 (*)    All other components within normal limits  CBC  TROPONIN I (HIGH SENSITIVITY)    EKG EKG Interpretation  Date/Time:  Friday September 13 2020 03:15:06 EDT Ventricular Rate:  120 PR Interval:  156 QRS Duration: 88 QT Interval:  314 QTC Calculation: 443 R Axis:   -39 Text Interpretation: Sinus tachycardia Left axis deviation Cannot rule out Anterior infarct , age undetermined Abnormal ECG Rate faster Confirmed by 12-16-1993 [phone]) on 09/13/2020 3:20:20 AM  Radiology DG Chest 2 View  Result Date: 09/13/2020 CLINICAL DATA:  20 year old male with chest pain. EXAM: CHEST - 2 VIEW COMPARISON:  Chest radiograph dated 05/28/2020. FINDINGS: Shallow inspiration. No focal consolidation, pleural effusion, or pneumothorax. The cardiac silhouette is within limits. No acute osseous pathology. IMPRESSION: No active cardiopulmonary disease. Electronically Signed   By: 05/30/2020 M.D.   On: 09/13/2020 03:35    Procedures Procedures   Medications Ordered in ED Medications  alum &  mag hydroxide-simeth (MAALOX/MYLANTA) 200-200-20 MG/5ML suspension 30 mL (has no administration in time range)    And  lidocaine (XYLOCAINE) 2 % viscous mouth solution 15 mL (has no administration in time range)  ondansetron (ZOFRAN-ODT) disintegrating tablet 4 mg (has no administration in time range)    ED Course  I have reviewed the triage vital signs and the nursing notes.  Pertinent labs & imaging results that were available during my care of the patient were reviewed by me  and considered in my medical decision making (see chart for details).    MDM Rules/Calculators/A&P                         20 year old male presenting to the ED with 3 to 4 weeks of intermittent chest pain.  This is worse after drinking lemon lime Gatorade and taking Advil.  States it feels like a burning pain in the middle of his chest and up into the throat.  Symptoms do seem to get better if he eats ice cream or starches.  He is afebrile and nontoxic in appearance here.  He was initially tachycardic on arrival but this is resolved by time of my evaluation without any acute intervention.  Does have some tenderness of the chest wall without any acute signs of trauma.  His EKG is nonischemic.  Labs are reassuring.  Does have some mild hyperglycemia but no evidence of DKA.  Troponin is negative.  Symptoms are suspicious for acid reflux.  Will treat with Zofran and GI cocktail.  6:24 AM Patient rpeorts feeling better after GI cocktail.  HR remains normal in the 80's on repeat exam.  Low suspicion for ACS, PE, dissection, other acute cardiac event.  Suspect this may be related to acid reflux.  Will start on Pepcid and have him limit spicy/acidic foods along with NSAIDs.  Close follow-up with PCP.  Return here for any new/acute changes.  Final Clinical Impression(s) / ED Diagnoses Final diagnoses:  Chest pain, unspecified type  Gastroesophageal reflux disease without esophagitis    Rx / DC Orders ED Discharge Orders      None        Garlon Hatchet, PA-C 09/13/20 [id number]    Glynn Octave, MD 09/13/20 [phone]

## 2020-09-13 NOTE — ED Triage Notes (Addendum)
Patient presents with back pain and chest pain. He said he feels like he is not taking care of himself. He said he has taken advil for his chest pain but it is not working. He said it feels like it is hard to breathe and that someone is poking him with a needle. Pt states it gets worse when he eats, it feels like indigestion. This has been going on for 3 weeks. 3 days ago he lifted something heavy and felt a crack in his back.

## 2020-09-14 ENCOUNTER — Other Ambulatory Visit: Payer: Self-pay

## 2020-09-14 ENCOUNTER — Emergency Department (HOSPITAL_COMMUNITY): Payer: Medicaid Other

## 2020-09-14 ENCOUNTER — Emergency Department (HOSPITAL_COMMUNITY)
Admission: EM | Admit: 2020-09-14 | Discharge: 2020-09-15 | Disposition: A | Discharge status: Home / Self Care | Payer: Medicaid Other | Attending: Emergency Medicine | Admitting: Emergency Medicine

## 2020-09-14 DIAGNOSIS — R0981 Nasal congestion: Secondary | ICD-10-CM | POA: Diagnosis not present

## 2020-09-14 DIAGNOSIS — Z20822 Contact with and (suspected) exposure to covid-19: Secondary | ICD-10-CM | POA: Diagnosis not present

## 2020-09-14 DIAGNOSIS — R61 Generalized hyperhidrosis: Secondary | ICD-10-CM | POA: Diagnosis not present

## 2020-09-14 DIAGNOSIS — R531 Weakness: Secondary | ICD-10-CM | POA: Diagnosis not present

## 2020-09-14 DIAGNOSIS — R042 Hemoptysis: Secondary | ICD-10-CM | POA: Insufficient documentation

## 2020-09-14 DIAGNOSIS — Z28310 Unvaccinated for covid-19: Secondary | ICD-10-CM | POA: Insufficient documentation

## 2020-09-14 DIAGNOSIS — R5383 Other fatigue: Secondary | ICD-10-CM | POA: Diagnosis not present

## 2020-09-14 DIAGNOSIS — R059 Cough, unspecified: Secondary | ICD-10-CM | POA: Insufficient documentation

## 2020-09-14 DIAGNOSIS — Z7984 Long term (current) use of oral hypoglycemic drugs: Secondary | ICD-10-CM | POA: Diagnosis not present

## 2020-09-14 DIAGNOSIS — E119 Type 2 diabetes mellitus without complications: Secondary | ICD-10-CM | POA: Insufficient documentation

## 2020-09-14 LAB — CBC WITH DIFFERENTIAL/PLATELET
Abs Immature Granulocytes: 0.03 10*3/uL (ref 0.00–0.07)
Basophils Absolute: 0 10*3/uL (ref 0.0–0.1)
Basophils Relative: 0 %
Eosinophils Absolute: 0.2 10*3/uL (ref 0.0–0.5)
Eosinophils Relative: 3 %
HCT: 40 % (ref 39.0–52.0)
Hemoglobin: 13.4 g/dL (ref 13.0–17.0)
Immature Granulocytes: 0 %
Lymphocytes Relative: 22 %
Lymphs Abs: 1.7 10*3/uL (ref 0.7–4.0)
MCH: 29.4 pg (ref 26.0–34.0)
MCHC: 33.5 g/dL (ref 30.0–36.0)
MCV: 87.7 fL (ref 80.0–100.0)
Monocytes Absolute: 0.8 10*3/uL (ref 0.1–1.0)
Monocytes Relative: 10 %
Neutro Abs: 5 10*3/uL (ref 1.7–7.7)
Neutrophils Relative %: 65 %
Platelets: 172 10*3/uL (ref 150–400)
RBC: 4.56 MIL/uL (ref 4.22–5.81)
RDW: 12.9 % (ref 11.5–15.5)
WBC: 7.7 10*3/uL (ref 4.0–10.5)
nRBC: 0 % (ref 0.0–0.2)

## 2020-09-14 LAB — COMPREHENSIVE METABOLIC PANEL
ALT: 51 U/L — ABNORMAL HIGH (ref 0–44)
AST: 36 U/L (ref 15–41)
Albumin: 3.9 g/dL (ref 3.5–5.0)
Alkaline Phosphatase: 61 U/L (ref 38–126)
Anion gap: 9 (ref 5–15)
BUN: 13 mg/dL (ref 6–20)
CO2: 24 mmol/L (ref 22–32)
Calcium: 8.5 mg/dL — ABNORMAL LOW (ref 8.9–10.3)
Chloride: 102 mmol/L (ref 98–111)
Creatinine, Ser: 0.92 mg/dL (ref 0.61–1.24)
GFR, Estimated: >60 mL/min (ref >60)
Glucose, Bld: 211 mg/dL — ABNORMAL HIGH (ref 70–99)
Potassium: 3.6 mmol/L (ref 3.5–5.1)
Sodium: 135 mmol/L (ref 135–145)
Total Bilirubin: 0.9 mg/dL (ref 0.3–1.2)
Total Protein: 7.1 g/dL (ref 6.5–8.1)

## 2020-09-14 LAB — RESP PANEL BY RT-PCR (FLU A&B, COVID) ARPGX2
Influenza A by PCR: NEGATIVE
Influenza B by PCR: NEGATIVE
SARS Coronavirus 2 by RT PCR: NEGATIVE

## 2020-09-14 LAB — LIPASE, BLOOD: Lipase: 28 U/L (ref 11–51)

## 2020-09-14 NOTE — ED Provider Notes (Signed)
[facility]-EMERGENCY DEPT Provider Note   CSN: [id number] Arrival date & time: 09/14/20  2117     History Chief Complaint  Patient presents with   Fatigue    Advit Trethewey is a 20 y.o. male past medical history of diabetes who presents for evaluation of cough, fatigue, generalized weakness.  He states this is been ongoing for about 3 weeks and feels like the cough is getting worse.  He states cough is productive of phlegm.  He was seen in the ED 3 days ago for evaluation of symptoms.  His work up was reassuring and he was discharged home.  He states that at home he has continued to cough.  He states that today, he had some blood when he coughed up.  He states that this is happened about 2 or 3 times.  He states he is not having any trouble breathing but he does have some nasal congestion.  He states that he feels some burning in his chest with coughing.  He states sometimes the sides will hurt to when he coughs.  He has not any abdominal pain, nausea/vomiting.  He has not measured any fevers but states he will have chills, sweats.  He denies any drastic weight loss.  He does state that sometimes he gets sweaty at night.  He used to smoke but quit about 2 to 3 years ago.  He has not been vaccinated for COVID.  No COVID exposure that he knows of.   The history is provided by the patient.      Past Medical History:  Diagnosis Date   Diabetes mellitus without complication (HCC)     There are no problems to display for this patient.   Past Surgical History:  Procedure Laterality Date   TONSILLECTOMY         Family History  Problem Relation Age of Onset   Hypertension Mother    Diabetes Mother    Hypertension Father    Diabetes Father     Social History   Tobacco Use   Smoking status: Never   Smokeless tobacco: Never  Vaping Use   Vaping Use: Some days   Substances: Nicotine  Substance Use Topics   Alcohol use: No   Drug use: No    Home  Medications Prior to Admission medications   Medication Sig Start Date End Date Taking? Authorizing Provider  acetaminophen (TYLENOL) 500 MG tablet Take 1,000 mg by mouth every 6 (six) hours as needed for moderate pain.   Yes [provider]  famotidine (PEPCID) 20 MG tablet Take 1 tablet (20 mg total) by mouth 2 (two) times daily. 09/13/20  Yes Garlon Hatchet, PA-C  ibuprofen (ADVIL) 200 MG tablet Take 400 mg by mouth every 6 (six) hours as needed for headache.   Yes [provider]  metFORMIN (GLUCOPHAGE) 1000 MG tablet Take 1 tablet (1,000 mg total) by mouth 2 (two) times daily with a meal. 05/28/20 09/14/20 Yes Pricilla Loveless, MD  clotrimazole (LOTRIMIN) 1 % cream Apply to affected area 2 times daily Patient not taking: No sig reported 11/13/19   Gailen Shelter, PA  doxycycline (VIBRAMYCIN) 100 MG capsule Take 1 capsule (100 mg total) by mouth 2 (two) times daily. One po bid x 7 days Patient not taking: No sig reported 05/28/20   Pricilla Loveless, MD  ibuprofen (ADVIL) 800 MG tablet Take 1 tablet (800 mg total) by mouth every 8 (eight) hours as needed for mild pain. Patient not  taking: No sig reported 08/23/18   Ward, Baxter Hire N, DO  ondansetron (ZOFRAN ODT) 4 MG disintegrating tablet Take 1 tablet (4 mg total) by mouth every 8 (eight) hours as needed for up to 10 doses for nausea or vomiting. Patient not taking: No sig reported 01/26/20   Sabino Donovan, MD  dicyclomine (BENTYL) 20 MG tablet Take 1 tablet (20 mg total) by mouth 2 (two) times daily. Patient taking differently: Take 20 mg by mouth 2 (two) times daily as needed for spasms.  08/02/18 08/23/18  Garlon Hatchet, PA-C    Allergies    Patient has no known allergies.  Review of Systems   Review of Systems  Constitutional:  Positive for chills, diaphoresis, fatigue and fever. Negative for unexpected weight change.  Respiratory:  Positive for cough. Negative for shortness of breath.   Cardiovascular:  Positive for chest  pain.  Gastrointestinal:  Negative for abdominal pain, nausea and vomiting.  Genitourinary:  Negative for dysuria and hematuria.  Neurological:  Negative for headaches.  All other systems reviewed and are negative.  Physical Exam Updated Vital Signs BP (!) 127/56   Pulse 94   Temp 99.8 F (37.7 C) (Oral)   Resp 18   SpO2 100%   Physical Exam Vitals and nursing note reviewed.  Constitutional:      Appearance: Normal appearance. He is well-developed.  HENT:     Head: Normocephalic and atraumatic.  Eyes:     General: Lids are normal.     Conjunctiva/sclera: Conjunctivae normal.     Pupils: Pupils are equal, round, and reactive to light.  Cardiovascular:     Rate and Rhythm: Normal rate and regular rhythm.     Pulses: Normal pulses.     Heart sounds: Normal heart sounds. No murmur heard.   No friction rub. No gallop.  Pulmonary:     Effort: Pulmonary effort is normal.     Breath sounds: Normal breath sounds.     Comments: Lungs clear to auscultation bilaterally.  Symmetric chest rise.  No wheezing, rales, rhonchi. Chest:     Comments: No crepitus noted on palpation of chest.  Abdominal:     Palpations: Abdomen is soft. Abdomen is not rigid.     Tenderness: There is no abdominal tenderness. There is no guarding.     Comments: Abdomen is soft, non-distended, non-tender. No rigidity, No guarding. No peritoneal signs.  Musculoskeletal:        General: Normal range of motion.     Cervical back: Full passive range of motion without pain.  Skin:    General: Skin is warm and dry.     Capillary Refill: Capillary refill takes less than 2 seconds.  Neurological:     Mental Status: He is alert and oriented to person, place, and time.  Psychiatric:        Speech: Speech normal.    ED Results / Procedures / Treatments   Labs (all labs ordered are listed, but only abnormal results are displayed) Labs Reviewed  COMPREHENSIVE METABOLIC PANEL - Abnormal; Notable for the following  components:      Result Value   Glucose, Bld 211 (*)    Calcium 8.5 (*)    ALT 51 (*)    All other components within normal limits  RESP PANEL BY RT-PCR (FLU A&B, COVID) ARPGX2  CBC WITH DIFFERENTIAL/PLATELET  LIPASE, BLOOD    EKG None  Radiology DG Chest 2 View  Result Date: 09/14/2020 CLINICAL DATA:  Cough  EXAM: CHEST - 2 VIEW COMPARISON:  09/13/2020 FINDINGS: The heart size and mediastinal contours are within normal limits. Both lungs are clear. The visualized skeletal structures are unremarkable. IMPRESSION: No active cardiopulmonary disease. Electronically Signed   By: Jasmine Pang M.D.   On: 09/14/2020 22:51   DG Chest 2 View  Result Date: 09/13/2020 CLINICAL DATA:  20 year old male with chest pain. EXAM: CHEST - 2 VIEW COMPARISON:  Chest radiograph dated 05/28/2020. FINDINGS: Shallow inspiration. No focal consolidation, pleural effusion, or pneumothorax. The cardiac silhouette is within limits. No acute osseous pathology. IMPRESSION: No active cardiopulmonary disease. Electronically Signed   By: Elgie Collard M.D.   On: 09/13/2020 03:35    Procedures Procedures   Medications Ordered in ED Medications - No data to display  ED Course  I have reviewed the triage vital signs and the nursing notes.  Pertinent labs & imaging results that were available during my care of the patient were reviewed by me and considered in my medical decision making (see chart for details).    MDM Rules/Calculators/A&P                          20 year old male who presents for evaluation of cough.  He states is been ongoing for about 3 to 4 weeks.  Today he states that he had episodes where he coughed up blood.  He states this is the first time that this happened to him.  He states that his chest feels sore and burning.  Occasionally, his eyes will feel sore as well.  He has not measured a fever but states he has had some subjective fever/chills at home.  No weight loss.  Does report he has  been sweating a lot.  He recently quit smoking.  No COVID-vaccine.  No COVID exposure that he knows of.  On initial arrival, he is afebrile, nontoxic-appearing.  Vital signs are stable.  Lungs clear to auscultation.  No evidence of respiratory distress.  No crepitus noted on the chest.  I suspect his episodes of hemoptysis are likely from continued coughing that has been ongoing.  Question there is infectious versus viral etiology.  Patient/physical exam not concerning for ACS etiology.  Will obtain lab work, imaging.  Chest x-ray negative.  Lipase normal.  CBC unremarkable.  CMP shows glucose of 211.  BUN/creatinine within normal limits.  Anion gap is normal.  Bicarb is normal.  COVID is negative.  Discussed results with patient.  Will obtain CT chest given his episodes of hemoptysis today. Patient signed out to Ivar Drape, PA-C pending CT chest.   Portions of this note were generated with Dragon dictation software. Dictation errors may occur despite best attempts at proofreading.   Final Clinical Impression(s) / ED Diagnoses Final diagnoses:  Cough  Hemoptysis    Rx / DC Orders ED Discharge Orders     None        Rosana Hoes 09/14/20 2353    Mancel Bale, MD 09/17/20 0000

## 2020-09-14 NOTE — ED Triage Notes (Signed)
Pt BIB EMS due to having generalized weakness for 3 weeks. Pt c/o sore throat and CP when he talks or eats/drinks. CBG was 211. Pt does have hx of diabetes. Pt has not checked sugar today.

## 2020-09-15 ENCOUNTER — Encounter (HOSPITAL_COMMUNITY): Payer: Self-pay

## 2020-09-15 ENCOUNTER — Emergency Department (HOSPITAL_COMMUNITY): Payer: Medicaid Other

## 2020-09-15 MED ORDER — IOHEXOL 350 MG/ML SOLN
60.0000 mL | Freq: Once | INTRAVENOUS | Status: AC | PRN
  Administered 2020-09-15: 60 mL via INTRAVENOUS

## 2020-09-15 MED ORDER — SODIUM CHLORIDE (PF) 0.9 % IJ SOLN
INTRAMUSCULAR | Status: AC
  Filled 2020-09-15: qty 50

## 2020-12-02 ENCOUNTER — Emergency Department (HOSPITAL_COMMUNITY): Payer: Medicaid Other

## 2020-12-02 ENCOUNTER — Emergency Department (HOSPITAL_COMMUNITY)
Admission: EM | Admit: 2020-12-02 | Discharge: 2020-12-02 | Disposition: A | Discharge status: Home / Self Care | Payer: Medicaid Other | Attending: Emergency Medicine | Admitting: Emergency Medicine

## 2020-12-02 ENCOUNTER — Encounter (HOSPITAL_COMMUNITY): Payer: Self-pay

## 2020-12-02 DIAGNOSIS — Z7984 Long term (current) use of oral hypoglycemic drugs: Secondary | ICD-10-CM | POA: Insufficient documentation

## 2020-12-02 DIAGNOSIS — U071 COVID-19: Secondary | ICD-10-CM | POA: Diagnosis not present

## 2020-12-02 DIAGNOSIS — B349 Viral infection, unspecified: Secondary | ICD-10-CM

## 2020-12-02 DIAGNOSIS — R079 Chest pain, unspecified: Secondary | ICD-10-CM | POA: Diagnosis present

## 2020-12-02 DIAGNOSIS — R0789 Other chest pain: Secondary | ICD-10-CM

## 2020-12-02 DIAGNOSIS — G44209 Tension-type headache, unspecified, not intractable: Secondary | ICD-10-CM

## 2020-12-02 DIAGNOSIS — E119 Type 2 diabetes mellitus without complications: Secondary | ICD-10-CM | POA: Insufficient documentation

## 2020-12-02 LAB — CBC WITH DIFFERENTIAL/PLATELET
Abs Immature Granulocytes: 0.04 10*3/uL (ref 0.00–0.07)
Basophils Absolute: 0 10*3/uL (ref 0.0–0.1)
Basophils Relative: 0 %
Eosinophils Absolute: 0.1 10*3/uL (ref 0.0–0.5)
Eosinophils Relative: 1 %
HCT: 40.5 % (ref 39.0–52.0)
Hemoglobin: 13.6 g/dL (ref 13.0–17.0)
Immature Granulocytes: 0 %
Lymphocytes Relative: 7 %
Lymphs Abs: 0.6 10*3/uL — ABNORMAL LOW (ref 0.7–4.0)
MCH: 29.8 pg (ref 26.0–34.0)
MCHC: 33.6 g/dL (ref 30.0–36.0)
MCV: 88.8 fL (ref 80.0–100.0)
Monocytes Absolute: 0.9 10*3/uL (ref 0.1–1.0)
Monocytes Relative: 10 %
Neutro Abs: 7.3 10*3/uL (ref 1.7–7.7)
Neutrophils Relative %: 82 %
Platelets: 203 10*3/uL (ref 150–400)
RBC: 4.56 MIL/uL (ref 4.22–5.81)
RDW: 13 % (ref 11.5–15.5)
WBC: 9 10*3/uL (ref 4.0–10.5)
nRBC: 0 % (ref 0.0–0.2)

## 2020-12-02 LAB — TROPONIN I (HIGH SENSITIVITY): Troponin I (High Sensitivity): 2 ng/L (ref <18)

## 2020-12-02 LAB — COMPREHENSIVE METABOLIC PANEL
ALT: 46 U/L — ABNORMAL HIGH (ref 0–44)
AST: 24 U/L (ref 15–41)
Albumin: 4.1 g/dL (ref 3.5–5.0)
Alkaline Phosphatase: 67 U/L (ref 38–126)
Anion gap: 7 (ref 5–15)
BUN: 12 mg/dL (ref 6–20)
CO2: 24 mmol/L (ref 22–32)
Calcium: 9.1 mg/dL (ref 8.9–10.3)
Chloride: 107 mmol/L (ref 98–111)
Creatinine, Ser: 0.98 mg/dL (ref 0.61–1.24)
GFR, Estimated: >60 mL/min (ref >60)
Glucose, Bld: 242 mg/dL — ABNORMAL HIGH (ref 70–99)
Potassium: 3.7 mmol/L (ref 3.5–5.1)
Sodium: 138 mmol/L (ref 135–145)
Total Bilirubin: 0.7 mg/dL (ref 0.3–1.2)
Total Protein: 7.2 g/dL (ref 6.5–8.1)

## 2020-12-02 LAB — SARS CORONAVIRUS 2 (TAT 6-24 HRS): SARS Coronavirus 2: POSITIVE — AB

## 2020-12-02 LAB — CBG MONITORING, ED: Glucose-Capillary: 236 mg/dL — ABNORMAL HIGH (ref 70–99)

## 2020-12-02 MED ORDER — ACETAMINOPHEN 325 MG PO TABS
650.0000 mg | ORAL_TABLET | Freq: Once | ORAL | Status: AC
  Administered 2020-12-02: 650 mg via ORAL
  Filled 2020-12-02: qty 2

## 2020-12-02 MED ORDER — SODIUM CHLORIDE 0.9 % IV BOLUS
1000.0000 mL | Freq: Once | INTRAVENOUS | Status: AC
  Administered 2020-12-02: 1000 mL via INTRAVENOUS

## 2020-12-02 MED ORDER — METOCLOPRAMIDE HCL 5 MG/ML IJ SOLN
10.0000 mg | Freq: Once | INTRAMUSCULAR | Status: AC
  Administered 2020-12-02: 10 mg via INTRAVENOUS
  Filled 2020-12-02: qty 2

## 2020-12-02 MED ORDER — DIPHENHYDRAMINE HCL 50 MG/ML IJ SOLN
25.0000 mg | Freq: Once | INTRAMUSCULAR | Status: AC
  Administered 2020-12-02: 25 mg via INTRAVENOUS
  Filled 2020-12-02: qty 1

## 2020-12-02 MED ORDER — ALUM & MAG HYDROXIDE-SIMETH 200-200-20 MG/5ML PO SUSP
30.0000 mL | Freq: Once | ORAL | Status: AC
  Administered 2020-12-02: 30 mL via ORAL
  Filled 2020-12-02: qty 30

## 2020-12-02 NOTE — Discharge Instructions (Addendum)
Tylenol and ibuprofen as needed to help with your symptoms. Make sure you are checking your blood sugars at home. We will call you with results of your COVID test when they are available if it is positive. Return to the ER for worsening symptoms, worsening headache, blurry vision, numbness in arms or legs or trouble breathing.

## 2020-12-02 NOTE — ED Provider Notes (Signed)
[facility]-EMERGENCY DEPT Provider Note   CSN: [id number] Arrival date & time: 12/02/20  1014     History Chief Complaint  Patient presents with   Headache   Generalized Body Aches    Andrew Schwartz is a 20 y.o. male with a past medical history of diabetes presenting to the ED with a chief complaint of chest pain, body aches and headache.  Woke up this morning with the symptoms.  He has not been able to check his blood sugar for the past few days and is concerned that the symptoms happen "when it is really high or really low."  He reports chest pain that is worse when he attempts to drink anything.  Denying any shortness of breath.  No vision changes, numbness in arms or legs or sick contacts with similar symptoms.  No abdominal pain or vomiting.  HPI     Past Medical History:  Diagnosis Date   Diabetes mellitus without complication (HCC)     There are no problems to display for this patient.   Past Surgical History:  Procedure Laterality Date   TONSILLECTOMY         Family History  Problem Relation Age of Onset   Hypertension Mother    Diabetes Mother    Hypertension Father    Diabetes Father     Social History   Tobacco Use   Smoking status: Never   Smokeless tobacco: Never  Vaping Use   Vaping Use: Some days   Substances: Nicotine  Substance Use Topics   Alcohol use: No   Drug use: No    Home Medications Prior to Admission medications   Medication Sig Start Date End Date Taking? Authorizing Provider  acetaminophen (TYLENOL) 500 MG tablet Take 1,000 mg by mouth every 6 (six) hours as needed for moderate pain.    [provider]  clotrimazole (LOTRIMIN) 1 % cream Apply to affected area 2 times daily Patient not taking: No sig reported 11/13/19   Gailen Shelter, PA  doxycycline (VIBRAMYCIN) 100 MG capsule Take 1 capsule (100 mg total) by mouth 2 (two) times daily. One po bid x 7 days Patient not taking: No sig reported  05/28/20   Pricilla Loveless, MD  famotidine (PEPCID) 20 MG tablet Take 1 tablet (20 mg total) by mouth 2 (two) times daily. 09/13/20   Garlon Hatchet, PA-C  ibuprofen (ADVIL) 200 MG tablet Take 400 mg by mouth every 6 (six) hours as needed for headache.    [provider]  ibuprofen (ADVIL) 800 MG tablet Take 1 tablet (800 mg total) by mouth every 8 (eight) hours as needed for mild pain. Patient not taking: No sig reported 08/23/18   Ward, Layla Maw, DO  metFORMIN (GLUCOPHAGE) 1000 MG tablet Take 1 tablet (1,000 mg total) by mouth 2 (two) times daily with a meal. 05/28/20 09/14/20  Pricilla Loveless, MD  ondansetron (ZOFRAN ODT) 4 MG disintegrating tablet Take 1 tablet (4 mg total) by mouth every 8 (eight) hours as needed for up to 10 doses for nausea or vomiting. Patient not taking: No sig reported 01/26/20   Sabino Donovan, MD  dicyclomine (BENTYL) 20 MG tablet Take 1 tablet (20 mg total) by mouth 2 (two) times daily. Patient taking differently: Take 20 mg by mouth 2 (two) times daily as needed for spasms.  08/02/18 08/23/18  Garlon Hatchet, PA-C    Allergies    Patient has no known allergies.  Review of Systems  Review of Systems  Constitutional:  Negative for appetite change, chills and fever.  HENT:  Negative for ear pain, rhinorrhea, sneezing and sore throat.   Eyes:  Negative for photophobia and visual disturbance.  Respiratory:  Negative for cough, chest tightness, shortness of breath and wheezing.   Cardiovascular:  Positive for chest pain. Negative for palpitations.  Gastrointestinal:  Negative for abdominal pain, blood in stool, constipation, diarrhea, nausea and vomiting.  Genitourinary:  Negative for dysuria, hematuria and urgency.  Musculoskeletal:  Positive for myalgias.  Skin:  Negative for rash.  Neurological:  Positive for headaches. Negative for dizziness, weakness and light-headedness.   Physical Exam Updated Vital Signs BP (!) 101/52   Pulse 95   Temp 99.9 F (37.7  C) (Oral)   Resp (!) 21   SpO2 100%   Physical Exam Vitals and nursing note reviewed.  Constitutional:      General: He is not in acute distress.    Appearance: He is well-developed.  HENT:     Head: Normocephalic and atraumatic.     Nose: Nose normal.  Eyes:     General: No scleral icterus.       Left eye: No discharge.     Extraocular Movements: Extraocular movements intact.     Conjunctiva/sclera: Conjunctivae normal.     Pupils: Pupils are equal, round, and reactive to light.  Cardiovascular:     Rate and Rhythm: Regular rhythm. Tachycardia present.     Heart sounds: Normal heart sounds. No murmur heard.   No friction rub. No gallop.  Pulmonary:     Effort: Pulmonary effort is normal. No respiratory distress.     Breath sounds: Normal breath sounds.  Abdominal:     General: Bowel sounds are normal. There is no distension.     Palpations: Abdomen is soft.     Tenderness: There is no abdominal tenderness. There is no guarding.  Musculoskeletal:        General: Normal range of motion.     Cervical back: Normal range of motion and neck supple.     Right lower leg: No edema.     Left lower leg: No edema.  Skin:    General: Skin is warm and dry.     Findings: No rash.  Neurological:     Mental Status: He is alert.     Motor: No abnormal muscle tone.     Coordination: Coordination normal.    ED Results / Procedures / Treatments   Labs (all labs ordered are listed, but only abnormal results are displayed) Labs Reviewed  COMPREHENSIVE METABOLIC PANEL - Abnormal; Notable for the following components:      Result Value   Glucose, Bld 242 (*)    ALT 46 (*)    All other components within normal limits  CBC WITH DIFFERENTIAL/PLATELET - Abnormal; Notable for the following components:   Lymphs Abs 0.6 (*)    All other components within normal limits  CBG MONITORING, ED - Abnormal; Notable for the following components:   Glucose-Capillary 236 (*)    All other components  within normal limits  SARS CORONAVIRUS 2 (TAT 6-24 HRS)  RESP PANEL BY RT-PCR (FLU A&B, COVID) ARPGX2  TROPONIN I (HIGH SENSITIVITY)    EKG EKG Interpretation  Date/Time:  Monday December 02 2020 13:47:10 EDT Ventricular Rate:  98 PR Interval:  165 QRS Duration: 96 QT Interval:  331 QTC Calculation: 423 R Axis:   20 Text Interpretation: Sinus rhythm Confirmed by Kristine Royal (  [id number]) on 12/02/2020 2:27:29 PM  Radiology DG Chest 2 View  Result Date: 12/02/2020 CLINICAL DATA:  Pt arrived via POV, c/o chest pain, headache, bodyaches x3 days. Denies any known sick contacts. Hx. Smoker. EXAM: CHEST - 2 VIEW COMPARISON:  09/14/2020.  CT, 09/15/2020. FINDINGS: The heart size and mediastinal contours are within normal limits. Both lungs are clear. No pleural effusion or pneumothorax. The visualized skeletal structures are unremarkable. IMPRESSION: No active cardiopulmonary disease. Electronically Signed   By: Amie Portland M.D.   On: 12/02/2020 14:03    Procedures Procedures   Medications Ordered in ED Medications  acetaminophen (TYLENOL) tablet 650 mg (650 mg Oral Given 12/02/20 1400)  sodium chloride 0.9 % bolus 1,000 mL (0 mLs Intravenous Stopped 12/02/20 1531)  metoCLOPramide (REGLAN) injection 10 mg (10 mg Intravenous Given 12/02/20 1433)  diphenhydrAMINE (BENADRYL) injection 25 mg (25 mg Intravenous Given 12/02/20 1434)  alum & mag hydroxide-simeth (MAALOX/MYLANTA) 200-200-20 MG/5ML suspension 30 mL (30 mLs Oral Given 12/02/20 1432)    ED Course  I have reviewed the triage vital signs and the nursing notes.  Pertinent labs & imaging results that were available during my care of the patient were reviewed by me and considered in my medical decision making (see chart for details).  Clinical Course as of 12/02/20 1533  Mon Dec 02, 2020  1351 Glucose-Capillary(!): 236 [HK]  1406 DG Chest 2 View No acute findings. [HK]  1413 Hemoglobin: 13.6 [HK]  1413 WBC: 9.0 [HK]  1443 Creatinine:  0.98 [HK]  1453 Pulse Rate: 97 [HK]  1453 Reports improvement on exam. Resting comfortably and tachycardia has improved. [HK]  1453 CO2: 24 [HK]  1453 Anion gap: 7 [HK]    Clinical Course User Index [HK] Dietrich Pates, PA-C   MDM Rules/Calculators/A&P                           Andrew Schwartz was evaluated in Emergency Department on 12/02/20 for the symptoms described in the history of present illness. He/she was evaluated in the context of the global COVID-19 pandemic, which necessitated consideration that the patient might be at risk for infection with the SARS-CoV-2 virus that causes COVID-19. Institutional protocols and algorithms that pertain to the evaluation of patients at risk for COVID-19 are in a state of rapid change based on information released by regulatory bodies including the CDC and federal and state organizations. These policies and algorithms were followed during the patient's care in the ED.  20 year old male presenting to the ED with a chief complaint of headache, generalized body aches and chest pain.  He is concerned about his blood sugars.  Symptoms have been going on for the past 3 days.  He has not checked his blood sugar in several days.  States that chest pain is worse when he drinks water.  Denies any blurry vision.  No neurodeficits on exam.  Will obtain labs including CBG and reassess.  CBG is 236.  His chest x-ray is unremarkable.  His EKG shows sinus rhythm, no ischemic changes, no STEMI.  CMP, CBC unremarkable.  Troponin is negative x1.  COVID test is pending.  Given IV fluids including migraine cocktail and Tylenol, GI cocktail. Reports improvement in symptoms with this. Chest pain could be related to reflux, doubt ACS based on his history, workup here, doubt PE as he is PERC negative. No structural cause seen on imaging that is concerning. There are no headache characteristics that are lateralizing  or concerning for increased ICP, infectious or vascular cause of his  symptoms.  Suspect viral cause of symptoms. Will have patient follow up on COVID test when available. Will treat symptomatically and have him follow up with PCP. Return precautions given.   Patient is hemodynamically stable, in NAD, and able to ambulate in the ED. Evaluation does not show pathology that would require ongoing emergent intervention or inpatient treatment. I explained the diagnosis to the patient. Pain has been managed and has no complaints prior to discharge. Patient is comfortable with above plan and is stable for discharge at this time. All questions were answered prior to disposition. Strict return precautions for returning to the ED were discussed. Encouraged follow up with PCP.   An After Visit Summary was printed and given to the patient.   Portions of this note were generated with Scientist, clinical (histocompatibility and immunogenetics). Dictation errors may occur despite best attempts at proofreading.  Final Clinical Impression(s) / ED Diagnoses Final diagnoses:  Acute non intractable tension-type headache  Chest wall pain  Viral syndrome    Rx / DC Orders ED Discharge Orders     None        Dietrich Pates, PA-C 12/02/20 1533    Wynetta Fines, MD 12/04/20 2306

## 2020-12-02 NOTE — ED Notes (Signed)
Pts mother is on the way to pick patient up.

## 2020-12-02 NOTE — ED Triage Notes (Signed)
Pt arrived via POV, c/o headache, bodyaches x3 days. Denies any known sick contacts.

## 2020-12-02 NOTE — ED Notes (Signed)
Patient transported to X-ray 

## 2020-12-12 ENCOUNTER — Emergency Department (HOSPITAL_COMMUNITY): Payer: Medicaid Other

## 2020-12-12 ENCOUNTER — Emergency Department (HOSPITAL_COMMUNITY)
Admission: EM | Admit: 2020-12-12 | Discharge: 2020-12-12 | Disposition: A | Discharge status: Home / Self Care | Payer: Medicaid Other | Attending: Emergency Medicine | Admitting: Emergency Medicine

## 2020-12-12 ENCOUNTER — Encounter (HOSPITAL_COMMUNITY): Payer: Self-pay | Admitting: Emergency Medicine

## 2020-12-12 DIAGNOSIS — U071 COVID-19: Secondary | ICD-10-CM | POA: Diagnosis not present

## 2020-12-12 DIAGNOSIS — E119 Type 2 diabetes mellitus without complications: Secondary | ICD-10-CM | POA: Insufficient documentation

## 2020-12-12 DIAGNOSIS — Z7984 Long term (current) use of oral hypoglycemic drugs: Secondary | ICD-10-CM | POA: Insufficient documentation

## 2020-12-12 DIAGNOSIS — R059 Cough, unspecified: Secondary | ICD-10-CM | POA: Diagnosis present

## 2020-12-12 DIAGNOSIS — R000 Tachycardia, unspecified: Secondary | ICD-10-CM | POA: Insufficient documentation

## 2020-12-12 NOTE — ED Triage Notes (Signed)
Patient here from home reporting COVID + 1.5 weeks ago. Reports ongoing symptoms of cough, sore throat, generalized body aches, chills, fever. Hx of DM.

## 2020-12-12 NOTE — ED Provider Notes (Signed)
[facility]-EMERGENCY DEPT Provider Note   CSN: [id number] Arrival date & time: 12/12/20  1854     History Chief Complaint  Patient presents with   Cough   Covid +    Andrew Schwartz is a 20 y.o. male. Who presents to the emergency department with COVID symptoms.  Diagnosed with COVID on 12/02/2020. States he has had ongoing cough, sore throat, body aches and intermittent fever. He denies sputum production, shortness of breath or difficult breathing, chest pain, changes in BG, headache, sinus pain or pressure.    Cough Associated symptoms: fever, myalgias and sore throat   Associated symptoms: no chest pain and no shortness of breath       Past Medical History:  Diagnosis Date   Diabetes mellitus without complication (HCC)     There are no problems to display for this patient.   Past Surgical History:  Procedure Laterality Date   TONSILLECTOMY         Family History  Problem Relation Age of Onset   Hypertension Mother    Diabetes Mother    Hypertension Father    Diabetes Father     Social History   Tobacco Use   Smoking status: Never   Smokeless tobacco: Never  Vaping Use   Vaping Use: Some days   Substances: Nicotine  Substance Use Topics   Alcohol use: No   Drug use: No    Home Medications Prior to Admission medications   Medication Sig Start Date End Date Taking? Authorizing Provider  acetaminophen (TYLENOL) 500 MG tablet Take 1,000 mg by mouth every 6 (six) hours as needed for moderate pain.    [provider]  clotrimazole (LOTRIMIN) 1 % cream Apply to affected area 2 times daily Patient not taking: No sig reported 11/13/19   Gailen Shelter, PA  doxycycline (VIBRAMYCIN) 100 MG capsule Take 1 capsule (100 mg total) by mouth 2 (two) times daily. One po bid x 7 days Patient not taking: No sig reported 05/28/20   Pricilla Loveless, MD  famotidine (PEPCID) 20 MG tablet Take 1 tablet (20 mg total) by mouth 2 (two) times  daily. 09/13/20   Garlon Hatchet, PA-C  ibuprofen (ADVIL) 200 MG tablet Take 400 mg by mouth every 6 (six) hours as needed for headache.    [provider]  ibuprofen (ADVIL) 800 MG tablet Take 1 tablet (800 mg total) by mouth every 8 (eight) hours as needed for mild pain. Patient not taking: No sig reported 08/23/18   Ward, Layla Maw, DO  metFORMIN (GLUCOPHAGE) 1000 MG tablet Take 1 tablet (1,000 mg total) by mouth 2 (two) times daily with a meal. 05/28/20 09/14/20  Pricilla Loveless, MD  ondansetron (ZOFRAN ODT) 4 MG disintegrating tablet Take 1 tablet (4 mg total) by mouth every 8 (eight) hours as needed for up to 10 doses for nausea or vomiting. Patient not taking: No sig reported 01/26/20   Sabino Donovan, MD  dicyclomine (BENTYL) 20 MG tablet Take 1 tablet (20 mg total) by mouth 2 (two) times daily. Patient taking differently: Take 20 mg by mouth 2 (two) times daily as needed for spasms.  08/02/18 08/23/18  Garlon Hatchet, PA-C    Allergies    Patient has no known allergies.  Review of Systems   Review of Systems  Constitutional:  Positive for fever.  HENT:  Positive for sore throat. Negative for sinus pressure, sinus pain and trouble swallowing.   Respiratory:  Positive for  cough. Negative for shortness of breath.   Cardiovascular:  Negative for chest pain.  Gastrointestinal:  Negative for abdominal distention.  Musculoskeletal:  Positive for myalgias.  All other systems reviewed and are negative.  Physical Exam Updated Vital Signs BP (!) 110/98 (BP Location: Right Arm)   Pulse (!) 110   Temp 99 F (37.2 C) (Oral)   Resp (!) 22   SpO2 96%   Physical Exam Vitals and nursing note reviewed.  Constitutional:      General: He is not in acute distress.    Appearance: Normal appearance. He is not toxic-appearing.  HENT:     Head: Normocephalic and atraumatic.     Nose: Congestion present.     Mouth/Throat:     Mouth: Mucous membranes are moist.     Pharynx: Oropharynx is  clear.  Eyes:     General: No scleral icterus.    Pupils: Pupils are equal, round, and reactive to light.  Cardiovascular:     Rate and Rhythm: Normal rate and regular rhythm.     Pulses: Normal pulses.     Heart sounds: No murmur heard. Pulmonary:     Effort: Pulmonary effort is normal. No respiratory distress.     Breath sounds: Normal breath sounds. No wheezing, rhonchi or rales.  Abdominal:     Palpations: Abdomen is soft.  Musculoskeletal:     Cervical back: Normal range of motion and neck supple.  Skin:    General: Skin is warm and dry.     Capillary Refill: Capillary refill takes less than 2 seconds.     Findings: No rash.  Neurological:     General: No focal deficit present.     Mental Status: He is alert and oriented to person, place, and time.  Psychiatric:        Mood and Affect: Mood normal.        Behavior: Behavior normal.        Thought Content: Thought content normal.        Judgment: Judgment normal.    ED Results / Procedures / Treatments   Labs (all labs ordered are listed, but only abnormal results are displayed) Labs Reviewed - No data to display  EKG None  Radiology No results found. See CXR ordered for full report - not crossing over into chart. "No active disease."  Procedures Procedures   Medications Ordered in ED Medications - No data to display  ED Course  I have reviewed the triage vital signs and the nursing notes.  Pertinent labs & imaging results that were available during my care of the patient were reviewed by me and considered in my medical decision making (see chart for details).    MDM Rules/Calculators/A&P 19yoM who presents with ongoing COVID-19 symptoms.  He is non-toxic appearing. Not hypoxic.  Initially somewhat tachycardic, but improved here. Likely fever driven.  He is in no acute respiratory distress. No pneumonia present on CXR.  Will discharge. Understands to return if increasing shortness of breath or sputum  production, intractable fevers.  Final Clinical Impression(s) / ED Diagnoses Final diagnoses:  COVID-19    Rx / DC Orders ED Discharge Orders     None        Cristopher Peru, PA-C 12/13/20 0104    Gerhard Munch, MD 12/15/20 2228

## 2020-12-12 NOTE — Discharge Instructions (Addendum)
You are seen in the emergency department today for ongoing COVID symptoms.  We did a chest x-ray while you were here which showed that you did not have pneumonia.  You are not having fevers at home.  You may feel poorly for a number of weeks and may have cough for a number of weeks.  Please push fluids including fluids with electrolytes like Gatorade or Powerade or Pedialyte.  You may return to emergency department if you start to have fevers again that are uncontrolled by Tylenol, you begin to cough up tenacious sputum.  May also return to emergency department any other reason.

## 2021-03-02 DEATH — deceased

## 2022-04-27 IMAGING — CR DG CHEST 2V
2 series · 2 of 2 positions shown · non-contrast
Comparison: 09/14/2020.  CT, 09/15/2020.

CLINICAL DATA: Pt arrived via POV, c/o chest pain, headache,
bodyaches x3 days. Denies any known sick contacts. Hx. Smoker.

EXAM:
CHEST - 2 VIEW

[w chest pa]
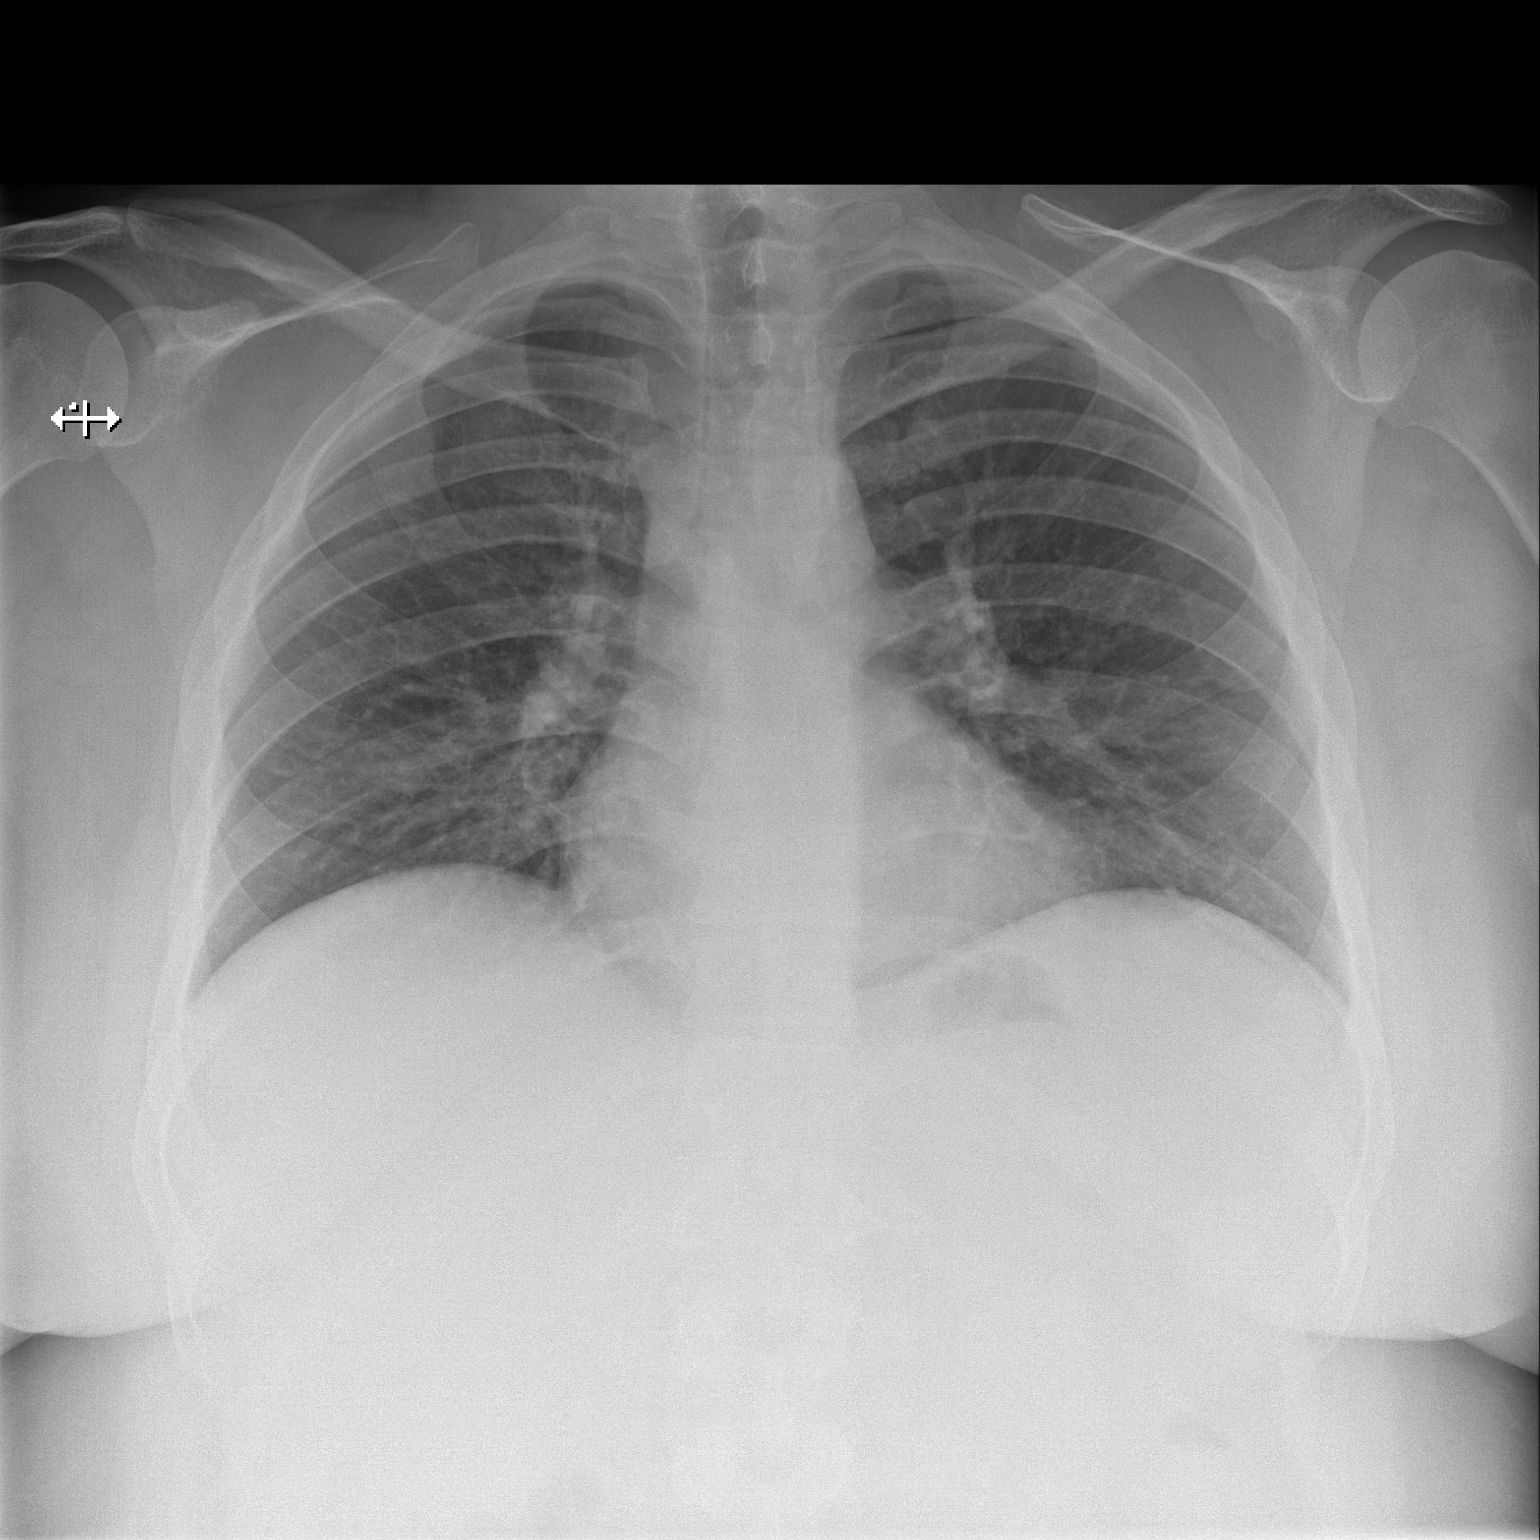

[w chest lat]
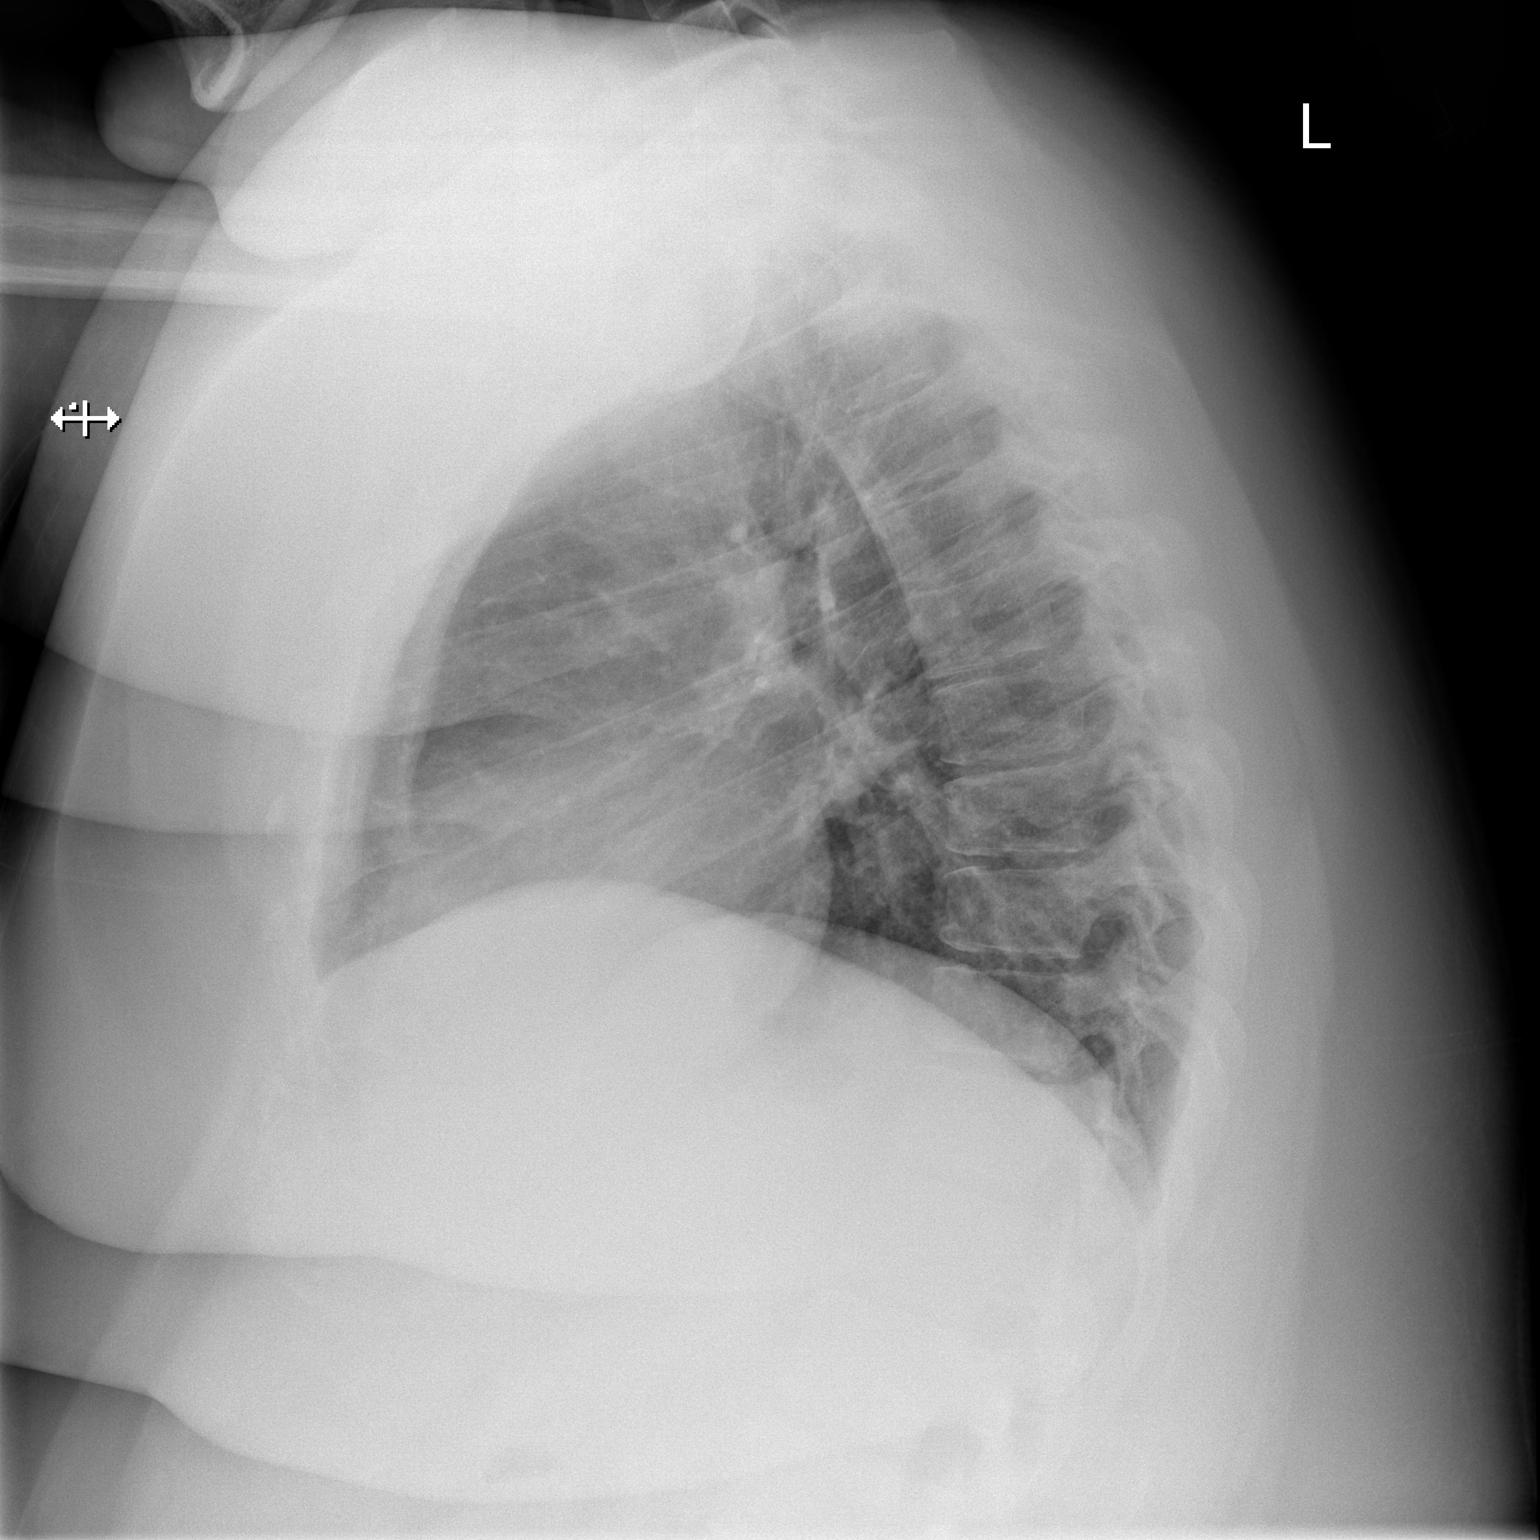

[2 of 2 positions shown; findings below may reference images not displayed]

FINDINGS: The heart size and mediastinal contours are within normal limits.
Both lungs are clear. No pleural effusion or pneumothorax. The
visualized skeletal structures are unremarkable.
IMPRESSION: No active cardiopulmonary disease.

## 2022-05-07 IMAGING — DX DG CHEST 1V PORT
1 series · 1 of 1 positions shown · non-contrast
Comparison: 12/02/2020

CLINICAL DATA: [id number]

EXAM:
PORTABLE CHEST 1 VIEW

[chest ap]
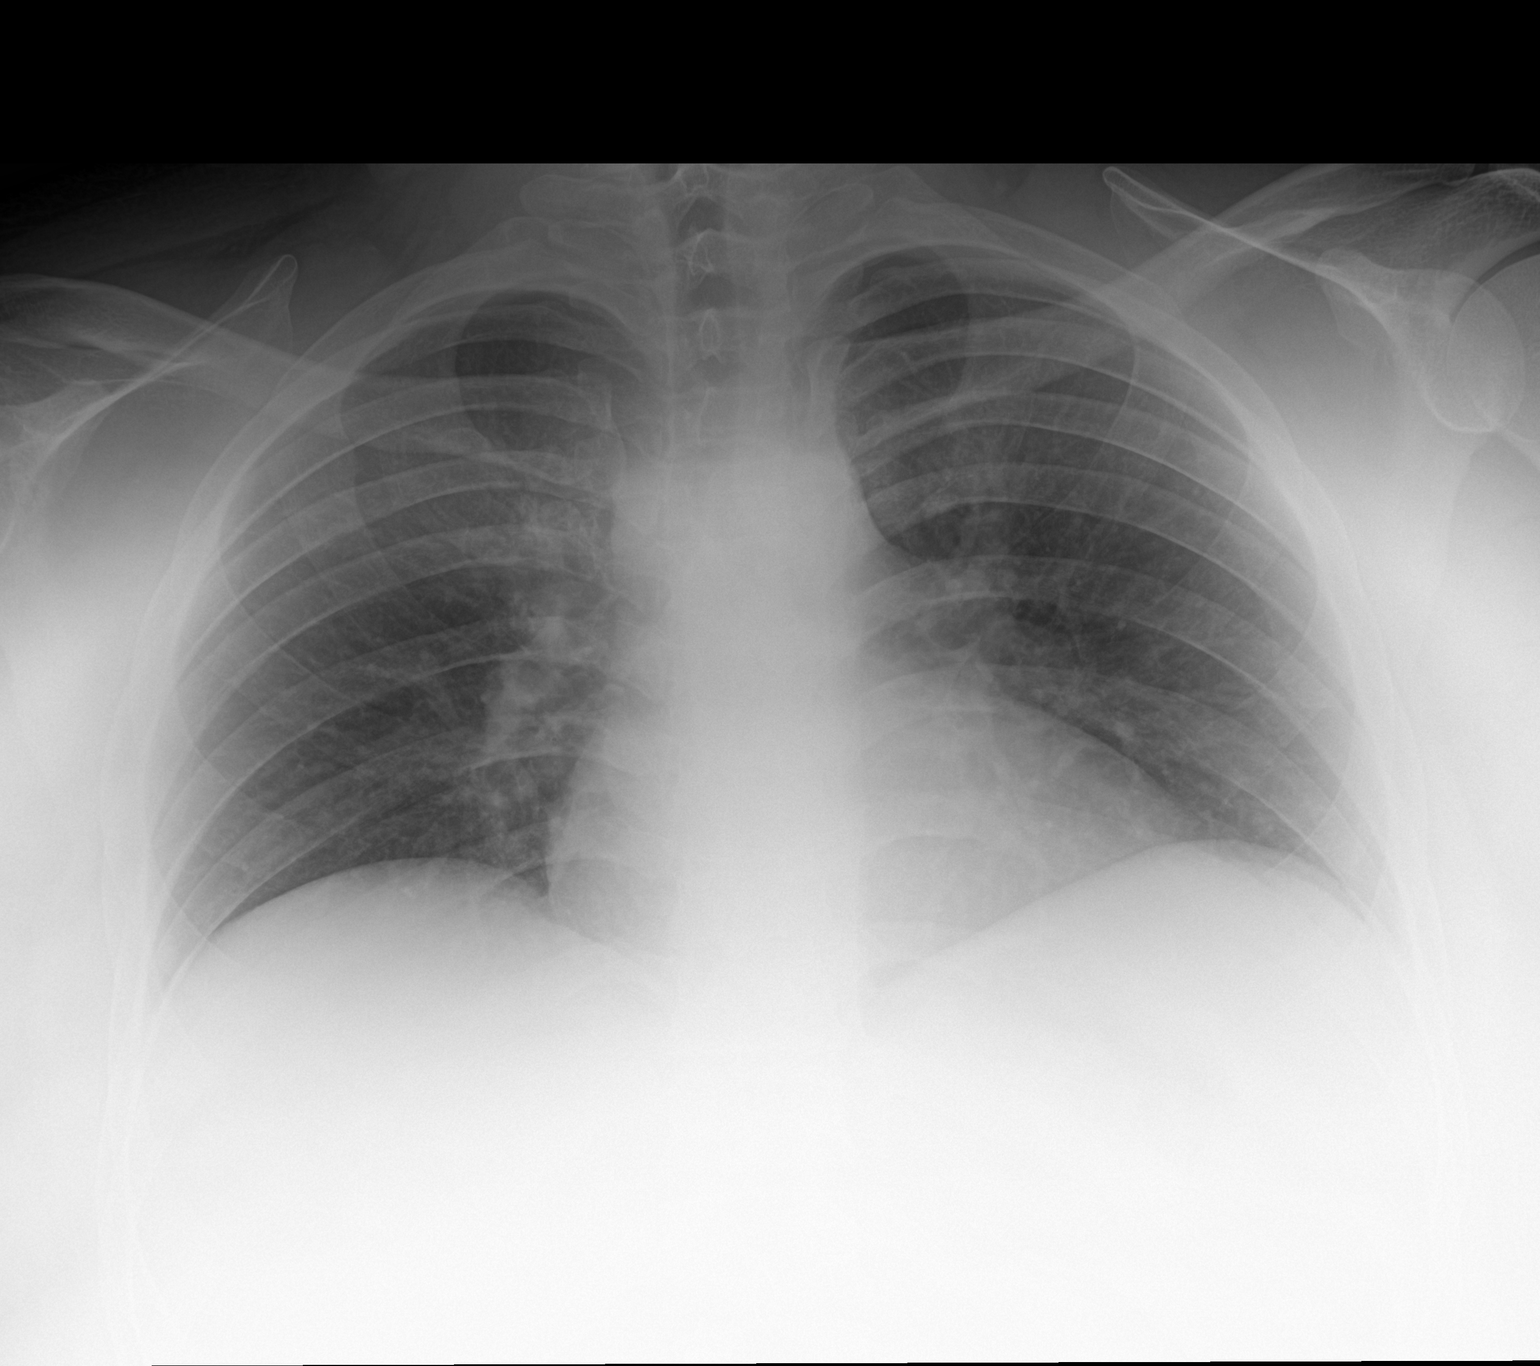

[1 of 1 positions shown; findings below may reference images not displayed]

FINDINGS: The heart size and mediastinal contours are within normal limits.
Both lungs are clear. The visualized skeletal structures are
unremarkable.
IMPRESSION: No active disease.
